# Patient Record
Sex: Female | Born: 1950 | Race: White | Hispanic: No | Marital: Married
Health system: Midwestern US, Academic
[De-identification: ages and names within clinical notes are randomized; demographics above are authoritative.]

---

## 2020-09-09 ENCOUNTER — Encounter: Admit: 2020-09-09 | Discharge: 2020-09-09 | Payer: MEDICARE

## 2020-09-09 NOTE — Telephone Encounter
09/09/20 - I will take care of this.  Records will be saved in the G-drive for 90 days or until appt is scheduled (whichever comes first)./ sjg  _________________________________________________________    Good afternoon,    Please scan to chart. No appt scheduled yet.  Lanise Mergen  MRN: [id number]    Thanks.    Franchot Mimes, Patient Service Representative [facility] Phone [phone] - Fax [phone] - [email or web address]  [address] [place], [place] [address]

## 2020-09-10 ENCOUNTER — Encounter: Admit: 2020-09-10 | Discharge: 2020-09-10 | Payer: MEDICARE

## 2020-09-10 NOTE — Telephone Encounter
-----   Message from [place] sent at 09/09/2020  3:44 PM CST -----  Regarding: Med Rec Req - New pt  Good afternoon,    Please request additional records from Aquilla Solian, APRN - Ph: [phone]; Fax: [phone]. Received Office Visit only.     Appt schedule with SBG on 12/21 at [facility].    Thanks.

## 2020-10-12 ENCOUNTER — Encounter: Admit: 2020-10-12 | Discharge: 2020-10-12 | Payer: MEDICARE

## 2020-10-13 ENCOUNTER — Encounter: Admit: 2020-10-13 | Discharge: 2020-10-13 | Payer: MEDICARE

## 2020-10-13 ENCOUNTER — Inpatient Hospital Stay: Admit: 2020-10-13 | Payer: MEDICARE

## 2020-10-13 ENCOUNTER — Inpatient Hospital Stay: Admit: 2020-10-13 | Discharge: 2020-10-13 | Payer: MEDICARE

## 2020-10-13 DIAGNOSIS — E039 Hypothyroidism, unspecified: Secondary | ICD-10-CM

## 2020-10-13 DIAGNOSIS — I5030 Unspecified diastolic (congestive) heart failure: Secondary | ICD-10-CM

## 2020-10-13 DIAGNOSIS — R000 Tachycardia, unspecified: Secondary | ICD-10-CM

## 2020-10-13 DIAGNOSIS — I10 Essential (primary) hypertension: Secondary | ICD-10-CM

## 2020-10-13 DIAGNOSIS — R0600 Dyspnea, unspecified: Secondary | ICD-10-CM

## 2020-10-13 DIAGNOSIS — R609 Edema, unspecified: Secondary | ICD-10-CM

## 2020-10-13 DIAGNOSIS — F419 Anxiety disorder, unspecified: Secondary | ICD-10-CM

## 2020-10-13 LAB — COMPREHENSIVE METABOLIC PANEL
Lab: 0.7 mg/dL — ABNORMAL HIGH (ref 0.4–1.00)
Lab: 105 MMOL/L — ABNORMAL LOW (ref 98–110)
Lab: 11 K/UL — ABNORMAL LOW (ref 3–12)
Lab: 142 MMOL/L (ref 137–147)
Lab: 15 U/L — ABNORMAL HIGH (ref 7–56)
Lab: 15 mg/dL (ref 7–25)
Lab: 23 U/L (ref 7–40)
Lab: 26 MMOL/L (ref 21–30)
Lab: 3.7 g/dL — ABNORMAL LOW (ref 3.5–5.0)
Lab: 4.3 MMOL/L (ref 3.5–5.1)
Lab: 89 mg/dL — ABNORMAL LOW (ref 70–100)
Lab: 97 U/L (ref 25–110)
Lab: >60 mL/min (ref >60)

## 2020-10-13 LAB — TROPONIN-I
Lab: 0 ng/mL (ref 0.0–0.05)
Lab: 0 ng/mL (ref 0.0–0.05)
Lab: 0 ng/mL — ABNORMAL HIGH (ref 0.0–0.05)

## 2020-10-13 LAB — CBC AND DIFF
Lab: 0 K/UL (ref 0–0.45)
Lab: 9.3 K/UL (ref 4.5–11.0)

## 2020-10-13 LAB — COVID-19 (SARS-COV-2) PCR

## 2020-10-13 LAB — MAGNESIUM: Lab: 1.8 mg/dL (ref 1.6–2.6)

## 2020-10-13 LAB — BNP (B-TYPE NATRIURETIC PEPTI): Lab: 997 pg/mL — ABNORMAL HIGH (ref 0–100)

## 2020-10-13 LAB — PROTIME INR (PT): Lab: 1.1 M/UL — ABNORMAL HIGH (ref 0.8–1.2)

## 2020-10-13 MED ORDER — SENNOSIDES-DOCUSATE SODIUM 8.6-50 MG PO TAB
1 | Freq: Two times a day (BID) | ORAL | 0 refills | Status: AC
Start: 2020-10-13 — End: ?
  Administered 2020-10-14 – 2020-10-16 (×4): 1 via ORAL

## 2020-10-13 MED ORDER — SIMETHICONE 80 MG PO CHEW
80 mg | ORAL | 0 refills | Status: AC | PRN
Start: 2020-10-13 — End: ?

## 2020-10-13 MED ORDER — BISACODYL 10 MG RE SUPP
10 mg | Freq: Every day | RECTAL | 0 refills | Status: AC | PRN
Start: 2020-10-13 — End: ?

## 2020-10-13 MED ORDER — CARVEDILOL 12.5 MG PO TAB
12.5 mg | Freq: Two times a day (BID) | ORAL | 0 refills | Status: AC
Start: 2020-10-13 — End: ?
  Administered 2020-10-14 – 2020-10-16 (×6): 12.5 mg via ORAL

## 2020-10-13 MED ORDER — LISINOPRIL 20 MG PO TAB
20 mg | Freq: Every day | ORAL | 0 refills | Status: AC
Start: 2020-10-13 — End: ?
  Administered 2020-10-14: 14:00:00 20 mg via ORAL

## 2020-10-13 MED ORDER — FUROSEMIDE 10 MG/ML IJ SOLN
40 mg | Freq: Once | INTRAVENOUS | 0 refills | Status: DC
Start: 2020-10-13 — End: 2020-10-14

## 2020-10-13 MED ORDER — ONDANSETRON 4 MG PO TBDI
4 mg | ORAL | 0 refills | Status: DC | PRN
Start: 2020-10-13 — End: 2020-10-13

## 2020-10-13 MED ORDER — MELATONIN 3 MG PO TAB
3 mg | Freq: Every evening | ORAL | 0 refills | Status: AC | PRN
Start: 2020-10-13 — End: ?

## 2020-10-13 MED ORDER — ACETAMINOPHEN 325 MG PO TAB
650 mg | ORAL | 0 refills | Status: AC | PRN
Start: 2020-10-13 — End: ?
  Administered 2020-10-14 – 2020-10-16 (×3): 650 mg via ORAL

## 2020-10-13 MED ORDER — HEPARIN, PORCINE (PF) 5,000 UNIT/0.5 ML IJ SYRG
5000 [IU] | SUBCUTANEOUS | 0 refills | Status: AC
Start: 2020-10-13 — End: ?
  Administered 2020-10-14 (×2): 5000 [IU] via SUBCUTANEOUS

## 2020-10-13 MED ORDER — ONDANSETRON HCL (PF) 4 MG/2 ML IJ SOLN
4 mg | INTRAVENOUS | 0 refills | Status: DC | PRN
Start: 2020-10-13 — End: 2020-10-13

## 2020-10-13 MED ORDER — SERTRALINE 25 MG PO TAB
25 mg | Freq: Every evening | ORAL | 0 refills | Status: DC
Start: 2020-10-13 — End: 2020-10-13

## 2020-10-13 MED ORDER — POLYETHYLENE GLYCOL 3350 17 GRAM PO PWPK
1 | Freq: Every day | ORAL | 0 refills | Status: AC | PRN
Start: 2020-10-13 — End: ?

## 2020-10-13 MED ORDER — LEVOTHYROXINE 75 MCG PO TAB
150 ug | Freq: Every day | ORAL | 0 refills | Status: AC
Start: 2020-10-13 — End: ?
  Administered 2020-10-14: 13:00:00 150 ug via ORAL

## 2020-10-13 NOTE — Progress Notes
Records Request    Khari Lett DOB: 04-18-1951    STAT- APPT TODAY    Medical records request for continuation of care:    Patient has appointment TODAY  with Dr. Arna Medici.    Please fax records to Cardiovascular [facility] [phone]    Request records:    Office Notes    EKG's           Recent Cardiac Testing    Any cardiac-related records    Recent Labs    Procedures    H&P/Discharge Summary    Operative Reports- Cardiac    Other      Thank you,      Cardiovascular Medicine  [facility]  [address]  [place], [place] [address]  Phone:  [phone]  Fax:  [phone]    STAT-APPT TODAY

## 2020-10-13 NOTE — H&P (View-Only)
History and Physical    Melinda Chen, Melinda Chen is a 69 y.o. female admitted on 10/13/2020.    Chief Complaint: dyspnea, orthopnea    Assessment/Plan     Melinda Chen, Melinda Chen is a 69 y.o. female morbidly obese w/ HTN, tachycardia, hypothyroidism, anxiety, BLE edema, presented to [facility] from cardiology clinic after she was noted to be tachycardic, hypertensive w/ dyspnea on exertion and orthopnea.       Dyspnea/orthopnea, stable  BLE edema, stable  - pt has been having dyspnea/BLE edema, wt gain since beginning of the year; had been progressively worsening. Gotten to the point where she wasn't able to walk more than 133ft prior to stopping to rest due to her dyspnea. Sx started to improve (both dyspnea, BLE edema) starting 08/2020 after pt was started on lasix 40mg  po bid. Since starting diuretic, she lost significant amount of wt as well (298lb to 229lb), which has also helped w/ her sx. Lasix dose recently decreased to lasix 40mg  po daily  - pt's dyspnea, BLE edema is actually much better today than it had been since starting her meds. She is now able to walk >1069ft w/o stopping (she is able to walk from parking lot to her clinic in the building)  - on arrival, pt was hypertensive and tachycardic, but this improved after starting BP meds (as below). Pt appears euvolemic on exam  Plan:  - continue home lasix 40mg  po dailly  - monitor on tele; keep K>4, Mg>2  - low Na diet, 2L fluid restriction; dietician consult  - HF consult  - getting TTE    HTN, uncontrolled  Tachycardia  - BP on arrival at 175/110; HR was 110 (in cardiology clinic, HR was 130s). Pt asymptomatic- no chest pain, dyspnea, n/v, lightheadedness/dizziness  - 12/21 ECG: HR 108, ST, QTc 520  Plan:  - starting coreg 12.5mg  bid and lisinopril 20mg  daily; titrate up as needed  - monitor on tele    Prolonged QTc  - hold all QTc prolonging meds (holding home zoloft for now)  - recheck ECG prior to d/c    Hypothyroidism  - checking TSH/T4  - continue home levothyroxine      DVT ppx: heparin    Fluids: n/a  Electrolytes: monitor bid  Nutrition: low Na, 2L fluid restriction  PT/OT: consulted      Dispo: admit to medicine       I spent a total of more than 70 minutes directly involved in pt care today with more than half of the time spent counseling pt face-to-face (explaining treatment options, disease processes, laboratory/imaging results, prognosis, risks and benefits of treatment options, medication side effects, importance of compliance with treatment, risk factor reduction, follow up with primary care physician), and on the unit/floor coordinating care.        Staff name:  Marcine Matar, MD Date:  10/13/2020   Med-private Team Sw[employer] 4  Team Pager [phone]    HPI     Pt presented to cardiology clinic today for routine appointment to establish care. Pt had been having trouble w/ dyspnea and wt gain throughout this whole year. She was evaluated by her PCP in 08/2020, at which time she was noted to have fluid in her lungs on CXR. She was started on lasix 40mg  po bid. At that time, she was also dxed w/ hypothyroidism; she was started on levothyroxine. After starting diuretic, her dyspnea and orthopnea improved significantly. Swelling in her legs also improved significantly. She had dramatic wt loss after  starting diuretic as well (11/15 wt 298lb. Down to 229lb on 12/21).   When seen by her PCP in 08/2020, she was also noted to have elevated BP. She wasn't started on any new meds. Was instructed to check her BP at home. BP had been ranging from 150s/90s at home; however, she suspects this is not accurate.   In cardiology clinic today, pt was noted to be severely hypertensive (170s/120s); she was also tachycardic (HR up to 130s). Despite her abnml v/s, pt reports that she has been doing well. She denies having any chest pain, dyspnea, palpitations, lightheadedness/dizziness. No n/v; admits to having abd distension and constipation, but no hematochezia/melena. No problems w/ UOP.       Past Medical History:  Medical History:   Diagnosis Date   ? Anxiety 10/13/2020   ? Class 3 severe obesity with body mass index (BMI) of 45.0 to 49.9 in adult [facility]) 10/13/2020   ? Edema 10/13/2020   ? Hypothyroid 10/13/2020   ? Tachycardia 10/13/2020       No past surgical history on file.    Medications:  No current facility-administered medications on file prior to encounter.     Current Outpatient Medications on File Prior to Encounter   Medication Sig Dispense Refill   ? furosemide (LASIX) 40 mg tablet Take 40 mg by mouth daily.     ? levothyroxine (SYNTHROID) 150 mcg tablet Take 150 mcg by mouth daily.     ? sertraline (ZOLOFT) 25 mg tablet Take 25 mg by mouth at bedtime daily. Take 1/2 tablet at bedtime         Allergies:  No Known Allergies      Family History:  Family History   Problem Relation Age of Onset   ? None Reported Mother    ? None Reported Father        Social History:  Social History     Socioeconomic History   ? Marital status: Married     Spouse name: Not on file   ? Number of children: Not on file   ? Years of education: Not on file   ? Highest education level: Not on file   Occupational History   ? Not on file   Tobacco Use   ? Smoking status: Former Smoker     Types: Cigarettes   ? Smokeless tobacco: Never Used   Substance and Sexual Activity   ? Alcohol use: Never   ? Drug use: Never   ? Sexual activity: Not on file   Other Topics Concern   ? Not on file   Social History Narrative   ? Not on file         Review of Systems:  Review of Systems   Constitutional: Negative for chills, diaphoresis, fever, malaise/fatigue and weight loss.   HENT: Negative.    Eyes: Negative.    Respiratory: Negative for cough, hemoptysis, sputum production and shortness of breath.    Cardiovascular: Positive for orthopnea and leg swelling. Negative for chest pain and palpitations.   Gastrointestinal: Positive for constipation. Negative for abdominal pain, blood in stool, diarrhea, heartburn, melena, nausea and vomiting.   Musculoskeletal: Negative.    Skin: Negative.    Neurological: Negative for dizziness, tremors, seizures, loss of consciousness, weakness and headaches.   Endo/Heme/Allergies: Negative.    Psychiatric/Behavioral: Negative.        Physical Exam:  Vital signs:  Vitals:    10/13/20 1546   BP: (!) 175/107  BP Source: Arm, Right Upper   Pulse: 110   Temp: 36.6 ?C (97.9 ?F)   SpO2: 97%   Weight: 104.1 kg (229 lb 9.6 oz)   Height: 167.6 cm (66)       No intake or output data in the 24 hours ending 10/13/20 1644    Physical exam:  General: NAD  Neck: no JVD  Cardiovascular: tachycardia, regular rhythm, no murmurs.   Pulmonary/Chest: Effort normal and breath sounds normal. No respiratory distress. No wheezes or crackles  Abdominal: Soft, nondistended, nontender, and no mass. Bowel sounds are normal.  Ext: +BLE edema. Nml distal pulses  Neurological: A&Ox4, no focal findings  Skin: Warm and dry. No rash noted. No erythema. No pallor.  Psychiatric: Normal mood and affect. Behavior is normal. Judgement and thought content normal.     Labs and imaging pending

## 2020-10-14 ENCOUNTER — Encounter: Admit: 2020-10-14 | Discharge: 2020-10-14 | Payer: MEDICARE

## 2020-10-14 ENCOUNTER — Inpatient Hospital Stay: Admit: 2020-10-14 | Discharge: 2020-10-14 | Payer: MEDICARE

## 2020-10-14 DIAGNOSIS — R000 Tachycardia, unspecified: Secondary | ICD-10-CM

## 2020-10-14 DIAGNOSIS — E039 Hypothyroidism, unspecified: Secondary | ICD-10-CM

## 2020-10-14 DIAGNOSIS — R609 Edema, unspecified: Secondary | ICD-10-CM

## 2020-10-14 DIAGNOSIS — F419 Anxiety disorder, unspecified: Secondary | ICD-10-CM

## 2020-10-14 MED ORDER — PERFLUTREN LIPID MICROSPHERES 1.1 MG/ML IV SUSP
1-20 mL | Freq: Once | INTRAVENOUS | 0 refills | Status: CP | PRN
Start: 2020-10-14 — End: ?
  Administered 2020-10-14: 15:00:00 1 mL via INTRAVENOUS

## 2020-10-14 MED ADMIN — SPIRONOLACTONE 25 MG PO TAB [7437]: 25 mg | ORAL | @ 18:00:00 | NDC 63739054410

## 2020-10-14 MED ADMIN — MAGNESIUM SULFATE IN D5W 1 GRAM/100 ML IV PGBK [166578]: 1 g | INTRAVENOUS | @ 21:00:00 | Stop: 2020-10-14 | NDC 00338170940

## 2020-10-14 MED ADMIN — FUROSEMIDE 10 MG/ML IJ SOLN [3291]: 40 mg | INTRAVENOUS | @ 18:00:00 | Stop: 2020-10-14 | NDC 36000028325

## 2020-10-14 MED ADMIN — MAGNESIUM OXIDE 400 MG (241.3 MG MAGNESIUM) PO TAB [10491]: 400 mg | ORAL | @ 06:00:00 | Stop: 2020-10-14 | NDC 10006070028

## 2020-10-14 MED ADMIN — MAGNESIUM SULFATE IN D5W 1 GRAM/100 ML IV PGBK [166578]: 1 g | INTRAVENOUS | @ 20:00:00 | Stop: 2020-10-14 | NDC 00338170940

## 2020-10-15 ENCOUNTER — Encounter: Admit: 2020-10-15 | Discharge: 2020-10-15 | Payer: MEDICARE

## 2020-10-15 ENCOUNTER — Inpatient Hospital Stay: Admit: 2020-10-15 | Discharge: 2020-10-15 | Payer: MEDICARE

## 2020-10-15 MED ADMIN — LORAZEPAM 2 MG/ML IJ SYRG [86485]: 1 mg | INTRAVENOUS | @ 05:00:00 | Stop: 2020-10-15 | NDC 00409198503

## 2020-10-15 MED ADMIN — ASPIRIN 81 MG PO CHEW [680]: 324 mg | ORAL | @ 12:00:00 | Stop: 2020-10-15 | NDC 66553000201

## 2020-10-15 MED ADMIN — LEVOTHYROXINE 125 MCG PO TAB [4424]: 125 ug | ORAL | @ 12:00:00 | NDC 51079044301

## 2020-10-15 MED ADMIN — THIAMINE MONONITRATE (VIT B1) 100 MG PO TAB [303868]: 100 mg | ORAL | @ 14:00:00 | Stop: 2020-10-20 | NDC 50268085111

## 2020-10-15 MED ADMIN — GADOBENATE DIMEGLUMINE 529 MG/ML (0.1MMOL/0.2ML) IV SOLN [135881]: 40 mL | INTRAVENOUS | @ 06:00:00 | Stop: 2020-10-15 | NDC 00270516415

## 2020-10-15 MED ADMIN — ENOXAPARIN 40 MG/0.4 ML SC SYRG [85052]: 40 mg | SUBCUTANEOUS | @ 02:00:00 | NDC 00781324602

## 2020-10-15 MED ADMIN — LOSARTAN 25 MG PO TAB [80886]: 25 mg | ORAL | @ 14:00:00 | NDC 00904704761

## 2020-10-16 MED ADMIN — THIAMINE MONONITRATE (VIT B1) 100 MG PO TAB [303868]: 100 mg | ORAL | @ 14:00:00 | Stop: 2020-10-17 | NDC 50268085111

## 2020-10-16 MED ADMIN — SPIRONOLACTONE 25 MG PO TAB [7437]: 25 mg | ORAL | @ 14:00:00 | Stop: 2020-10-17 | NDC 63739054410

## 2020-10-16 MED ADMIN — LOSARTAN 25 MG PO TAB [80886]: 25 mg | ORAL | @ 14:00:00 | Stop: 2020-10-17 | NDC 00904704761

## 2020-10-16 MED ADMIN — ERGOCALCIFEROL (VITAMIN D2) 1,250 MCG (50,000 UNIT) PO CAP [81876]: 50000 [IU] | ORAL | @ 14:00:00 | Stop: 2020-10-17 | NDC 60687050011

## 2020-10-16 MED ADMIN — FUROSEMIDE 10 MG/ML IJ SOLN [3291]: 40 mg | INTRAVENOUS | @ 14:00:00 | Stop: 2020-10-16 | NDC 36000028325

## 2020-10-16 MED ADMIN — LEVOTHYROXINE 125 MCG PO TAB [4424]: 125 ug | ORAL | @ 12:00:00 | Stop: 2020-10-17 | NDC 51079044301

## 2020-10-16 MED ADMIN — FLU VACC QS2021-22(65YR UP)-PF 240 MCG/0.7 ML IM SYRG [457008]: 0.7 mL | INTRAMUSCULAR | @ 22:00:00 | Stop: 2020-10-16 | NDC 49281012188

## 2020-10-19 ENCOUNTER — Encounter: Admit: 2020-10-19 | Discharge: 2020-10-19 | Payer: MEDICARE

## 2020-10-19 NOTE — Telephone Encounter
Patient Discharge Date from hospital: 10/16/20  Date Call Attempted: 10/19/20  Number of Attempts: 1  Date Call Completed: 10/19/20     Next Appointment    Next follow-up telehealth appointment on 10/21/20 with Satira Anis, APRN.    Transportation    NA     Home Health    No    Medications    Pt states she has all her medications and has no questions at this time.    Diet    200 mg cholesterol, 2 G Na, 2 L fluid restriction    Scale/Weight    Yes, first morning weight today was 221.4.    Signs and Symptoms    Pt reports the following symptoms:     No BLE edema or abdominal bloating. No SOA at rest or with exertion. No PND.     Pt verbalized understanding of signs and symptoms of HF and when to contact a provider or seek immediate assistance at the ER.    Intervention(s)    Pt educated on the importance of weighing daily first thing in the morning after voiding using the same scale in the same location and write results down in note pad or log. Notify us for weight gains of 3 lbs in one day or 5 lbs in one week. Notify us for increased SOA.  Notify us for swelling or increased swelling in BLE or abdominal fullness/bloating. Call 911 for sudden, severe chest/pain pressure/SOA develops. Be sure to make your follow up appointment and bring your weight logs, B/P logs, and medication list with you to your appointment. Call us at [phone] if you have any questions.       Plan of Care    Continued education needed for heart failure symptom management and when to contact our office.

## 2020-10-20 ENCOUNTER — Encounter: Admit: 2020-10-20 | Discharge: 2020-10-20 | Payer: MEDICARE

## 2020-10-20 ENCOUNTER — Ambulatory Visit: Admit: 2020-10-20 | Discharge: 2020-10-20 | Payer: MEDICARE

## 2020-10-20 DIAGNOSIS — Z9189 Other specified personal risk factors, not elsewhere classified: Secondary | ICD-10-CM

## 2020-10-21 ENCOUNTER — Encounter: Admit: 2020-10-21 | Discharge: 2020-10-21 | Payer: MEDICARE

## 2020-10-21 ENCOUNTER — Ambulatory Visit: Admit: 2020-10-21 | Discharge: 2020-10-21 | Payer: MEDICARE

## 2020-10-21 DIAGNOSIS — E039 Hypothyroidism, unspecified: Secondary | ICD-10-CM

## 2020-10-21 DIAGNOSIS — I5022 Chronic systolic (congestive) heart failure: Secondary | ICD-10-CM

## 2020-10-21 DIAGNOSIS — I10 Essential (primary) hypertension: Secondary | ICD-10-CM

## 2020-10-21 DIAGNOSIS — R000 Tachycardia, unspecified: Secondary | ICD-10-CM

## 2020-10-21 DIAGNOSIS — R609 Edema, unspecified: Secondary | ICD-10-CM

## 2020-10-21 DIAGNOSIS — F419 Anxiety disorder, unspecified: Secondary | ICD-10-CM

## 2020-10-21 NOTE — Progress Notes
Date of Service: 10/21/2020    Obtained patient's verbal consent to treat them and their agreement to [facility] financial policy and NPP via this telehealth visit during the [facility].    This video home visit was conducted via communication between the patient and physician/providerdistance. We have found that certain health care needs can be provided without an in-person visit.. This service lets Melinda Chen provide the care patients' need.  If a prescription is necessary we can send it directly to their pharmacy.  If testing is necessary this can be arranged.    Melinda Chen is a 69 y.o. female.     HPI      Melinda Chen was seen today in heart failure clinic for posthospitalization follow-up visit.  Patient was recently seen by Dr. Arna Medici on 10/13/20 to establish care with cardiology and reported having symptoms of dyspnea with exertion, orthopnea, tachycardia and edema for the past year. She reportedly went to urgent care 09/07/20 and was found to have hypothyroidism (thyroid levels were undetectable) and hypervolemia.  Chest xray showed pulmonary vascular congestion, BNP 1420. She was started on lasix 40 mg bid and ~2 weeks later when she returned to the primary care office her Lasix was decreased to 40 mg daily and she was instructed to establish care with cardiology. When she was seen by Dr Arna Medici; she stated that she lost 72 lbs since starting lasix and her dyspnea improved. Dr. Arna Medici recommended hospitalization for decompensated CHF. She was admitted on 10/13/20 to 10/16/20 for acute on chronic systolic heart failure.  Her echo during admission showed EF 22%, mod/sev MR, RV dilation and elevated CVP. LHC showed no obstructive disease. 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 She was started on GDMT with coreg 12.5 mg bid, losartan 25 mg daily, aldactone 25 mg daily, lasix 40 mg daily. She was also given a Technical sales engineer at discharge. Her TSH was found to be low with high Free T4, her levothyroxine dose was 125 mcg. She needs to repeat TFTs in 4-6 weeks. Her admit weight 230 lbs; discharge weight was 223 lbs.     Today she reports that since hospitalizations she has been feeling well. Her daily weights have gone from 223 lbs at discharge to 219 lbs on 10/21/20. Her home BP is averaging 120/80, HR 80, 02 sats 92-96. Her my fitness pal app shows that she is consuming between 1479-2172 mg Na. She is consuming 64 fluid ounces of liquid daily. She denies orthopnea, paroxysmal nocturnal dyspnea, early satiety, abdominal distention, lower extremity edema, chest pain, irregular heartbeat/palpitations, dizziness, and/or syncope/near syncope.      Past medical history includes HFpEF, HTN, hypothyroidism, morbid obesity.        Telehealth Patient Reported Vitals     Row Name 10/21/20 1128 10/21/20 1118             BP: 127/84 ?       BP Source: Arm, Left Upper ?       BP Position: Sitting ?       BP Method: Automatic/Digital Reading ?       Pulse: 85 ?       Weight: 99.7 kg (219 lb 12.8 oz) ?       Height: 1.676 m (5' 6) ?       SpO2: 94 % ?       Pain Score: Zero ?       Pain Score ? Zero  Patient Position ? Sitting       BP Source ? Arm, Right Upper                 Reports having a blood pressure cuff and scale.     Body mass index is 35.48 kg/m?Marland Kitchen     Review of Systems   Constitutional: Positive for weight loss.   Cardiovascular: Negative for chest pain, dyspnea on exertion, leg swelling, orthopnea, palpitations and paroxysmal nocturnal dyspnea.   Respiratory: Negative for shortness of breath.    Gastrointestinal: Negative for bloating and abdominal pain.   Neurological: Negative for weakness.       Physical exam limited d/t video tele health visit:  General Appearance: patient in no apparent distress.  Skin: pink, no jaundice, exposed skin is intact  Eyes: conjunctivae and lids normal, pupils are equal and round   Lips: no pallor or cyanosis   Neck Veins: VESSELS:JVP difficult to appreciate Respiratory Effort: breathing comfortably, no respiratory distress   Lower extremities: unable to see on telehealth  Orientation: oriented to time, place and person   PSYCH: Appropriate   Language and Memory: patient responsive and seems to comprehend information   NEURO: Alert and conversant    Cardiovascular Studies  Echo 10/14/20  ? The left ventricular systolic function is severely reduced. The ejection fraction by Simpson's biplane method is 22%.  ? The right ventricle is moderately dilated with moderately reduced function.  ? Moderate-severe posteriorly directed likely functional mitral valve regurgitation present.     Tachycardia throughout the study.   There are no prior studies available for comparison- patient has already been admitted to the hospital for futher management.   ?  Right and left Heart Cath 10/15/20  CONCLUSIONS:    1. Elevated filling pressures, most notably on the left side with elevated pulmonary artery pressures.  2. Preserved cardiac output and cardiac index.  3. No significant coronary artery disease.      Problems Addressed Today  Encounter Diagnoses   Name Primary?   ? Primary hypertension Yes   ? Chronic systolic heart failure (HCC)        Assessment and Plan     Chronic Heart Failure with Reduced Ejection Fraction  -Echo on 10/14/20 showed an EF of 22%.  -Today, she describes NYHA Functional Class II symptoms however due to nature of tele health visit I am unable to physical examine her. Will not adjust her medications doses; encouraged her to continue adhering to low sodium diet and fluid restrictions.   GDMT Current Doses Changes   BB Coreg 12.5 mg bid No changes    ACEI/ARB/ARNI Losartan 25 mg daily No Changes    SGLT-2 Inhibitor Not receiving therapeutic doses of ACE-i/ARB and BB   *will consider after routine labs are checked at her next f/u visit   Aldosterone Antagonist Spironolactone 25 mg daily No changes   Diuretic Lasix 40 mg daily No changes   Hydralazine/ Nitrate N/A Ivabradine NA    HRMT *Pt discharged wearing Life Vest      Anticoagulation for Afib/flutter  N/A      ?Cardiac Rehab Evaluation? for LVEF <40%  NA    7-day post hospital follow up scheduled within 48hrs of discharge Yes      Non-Ischemic Cardiomyopathy:    -GDMT as described above. She is currently wearing Life Vest. Will need to repeat echocardiogram in the next 2-3 months to evaluate if her EF has improved or alternatively if she will  require device therapy.     Hypertension:  Controlled on current regimen, encouraged her to continue checking her v/s at home.     Follow Up Visit: Will have her scheduled back with Dr Arna Medici when he returns to [place] clinic next time.          Education/care coordination and counseling time spent: 35 minutes of 40 minute visit.  Topics included HF disease process, medication instructions, daily weight monitoring, sodium and fluid restriction, understanding of HF symptoms, awareness of when and whom call, and when to schedule next office visit.     Thank you for the opportunity to participate in this pleasant patient's care. Please do not hesitate to contact me with any questions/concerns.    Satira Anis APRN   Pager [phone]    Collaborating MD:[facility]    Current Medications (including today's revisions)  ? carvediloL (COREG) 12.5 mg tablet Take one tablet by mouth twice daily. Take with food.   ? [START ON 10/23/2020] ergocalciferol (vitamin D2) (DRISDOL) 1,250 mcg (50,000 unit) capsule Take one capsule by mouth every 7 days.   ? furosemide (LASIX) 40 mg tablet Take 40 mg by mouth daily.   ? levothyroxine (SYNTHROID) 125 mcg tablet Take one tablet by mouth daily.   ? losartan (COZAAR) 25 mg tablet Take one tablet by mouth daily.   ? sertraline (ZOLOFT) 25 mg tablet Take 25 mg by mouth at bedtime daily. Take 1/2 tablet at bedtime   ? spironolactone (ALDACTONE) 25 mg tablet Take one tablet by mouth daily. Take with food.

## 2020-10-22 ENCOUNTER — Encounter: Admit: 2020-10-22 | Discharge: 2020-10-22 | Payer: MEDICARE

## 2020-10-27 ENCOUNTER — Encounter: Admit: 2020-10-27 | Discharge: 2020-10-27 | Payer: MEDICARE

## 2020-10-27 NOTE — Telephone Encounter
Patient is scheduled with Dr. Geronimo Boot 11/19/20 and with Dr. Arna Medici 12/11/19. Patient is aware of date time and location.     Lab order faxed to [facility]. [phone]

## 2020-10-27 NOTE — Telephone Encounter
-----   Message from Valora Piccolo, RN sent at 10/27/2020  1:40 PM CST -----  SBG pt.   ----- Message -----  From: Ardelle Lesches  Sent: 10/27/2020   1:38 PM CST  To: Cvm Nurse Gen Card Team [facility]    Per  jan  caldwell this pt needs to be seen  ,  hopefully  next month in the  [place]  office  can  you assit ?  Thanks  a lot

## 2020-11-11 ENCOUNTER — Encounter: Admit: 2020-11-11 | Discharge: 2020-11-11 | Payer: MEDICARE

## 2020-11-11 NOTE — Telephone Encounter
Attempted to contact pt about getting set up for cardiac rehab. Number out of service, therefore unable to leave a message. Cardiac rehab staff will attempt another call.

## 2020-11-12 ENCOUNTER — Encounter: Admit: 2020-11-12 | Discharge: 2020-11-12 | Payer: MEDICARE

## 2020-11-12 DIAGNOSIS — I10 Essential (primary) hypertension: Secondary | ICD-10-CM

## 2020-11-12 DIAGNOSIS — I5022 Chronic systolic (congestive) heart failure: Secondary | ICD-10-CM

## 2020-11-12 LAB — BASIC METABOLIC PANEL
Lab: 1.1
Lab: 101
Lab: 137
Lab: 14
Lab: 27
Lab: 27
Lab: 4.5
Lab: 49
Lab: 9.5
Lab: 94

## 2020-11-13 ENCOUNTER — Encounter: Admit: 2020-11-13 | Discharge: 2020-11-13 | Payer: MEDICARE

## 2020-11-13 NOTE — Telephone Encounter
-----   Message from [name], APRN-NP sent at 11/13/2020 10:06 AM CST -----  Stable labs; I think I sent a message to get an update on her weights and symptoms please. ThanksJC

## 2020-11-13 NOTE — Telephone Encounter
Called pt for update. She states she feels great no symptoms to report.    She did inquire about a refill for her Levothyroxine and we discussed she will call her PCP since she originally filled the prescription.

## 2020-11-16 ENCOUNTER — Encounter: Admit: 2020-11-16 | Discharge: 2020-11-16 | Payer: MEDICARE

## 2020-11-16 NOTE — Telephone Encounter
Today I contacted the patient with regards to scheduling an initial orientation appointment for Cardiopulmonary Rehabilitation. After stating the purpose of the phone call and providing a brief overview of the program, the pt is no longer interested in participating and elected to decline our service. The pt stated she has an exercise routine at home that she feels comfortable continuing. Before ending the phone call with the patient I shared with them the department's phone number [phone]) and informed them, should they change their mind and would like to participate in the future, they can contact us. In addition to this encounter, a note was made on our department's paper tracking. Pt will be removed from our call/waitlist.

## 2020-11-19 ENCOUNTER — Encounter: Admit: 2020-11-19 | Discharge: 2020-11-19 | Payer: MEDICARE

## 2020-11-19 DIAGNOSIS — R0600 Dyspnea, unspecified: Secondary | ICD-10-CM

## 2020-11-19 DIAGNOSIS — R609 Edema, unspecified: Secondary | ICD-10-CM

## 2020-11-19 DIAGNOSIS — I10 Essential (primary) hypertension: Secondary | ICD-10-CM

## 2020-11-19 DIAGNOSIS — I5022 Chronic systolic (congestive) heart failure: Secondary | ICD-10-CM

## 2020-11-19 DIAGNOSIS — I5023 Acute on chronic systolic (congestive) heart failure: Secondary | ICD-10-CM

## 2020-11-19 DIAGNOSIS — E039 Hypothyroidism, unspecified: Secondary | ICD-10-CM

## 2020-11-19 DIAGNOSIS — R000 Tachycardia, unspecified: Secondary | ICD-10-CM

## 2020-11-19 DIAGNOSIS — F419 Anxiety disorder, unspecified: Secondary | ICD-10-CM

## 2020-11-19 MED ORDER — LOSARTAN 50 MG PO TAB
50 mg | ORAL_TABLET | Freq: Every day | ORAL | 3 refills | 30.00000 days | Status: AC
Start: 2020-11-19 — End: ?

## 2020-11-19 NOTE — Patient Instructions
Echocardiogram    Take 50mg  Cozaar daily    Follow up with Dr. in February

## 2020-11-20 ENCOUNTER — Encounter: Admit: 2020-11-20 | Discharge: 2020-11-20 | Payer: MEDICARE

## 2020-11-20 ENCOUNTER — Ambulatory Visit: Admit: 2020-11-20 | Discharge: 2020-11-20 | Payer: MEDICARE

## 2020-11-20 DIAGNOSIS — Z9189 Other specified personal risk factors, not elsewhere classified: Secondary | ICD-10-CM

## 2020-11-25 ENCOUNTER — Encounter: Admit: 2020-11-25 | Discharge: 2020-11-25 | Payer: MEDICARE

## 2020-11-25 ENCOUNTER — Ambulatory Visit: Admit: 2020-11-25 | Discharge: 2020-11-25 | Payer: MEDICARE

## 2020-11-25 DIAGNOSIS — I10 Essential (primary) hypertension: Secondary | ICD-10-CM

## 2020-11-25 DIAGNOSIS — I5022 Chronic systolic (congestive) heart failure: Secondary | ICD-10-CM

## 2020-11-25 DIAGNOSIS — R0600 Dyspnea, unspecified: Secondary | ICD-10-CM

## 2020-11-28 ENCOUNTER — Encounter: Admit: 2020-11-28 | Discharge: 2020-11-28 | Payer: MEDICARE

## 2020-11-30 ENCOUNTER — Encounter: Admit: 2020-11-30 | Discharge: 2020-11-30 | Payer: MEDICARE

## 2020-11-30 NOTE — Telephone Encounter
-----   Message from Rogelia Boga, RN sent at 11/30/2020  9:49 AM CST -----  Regarding: Ask TLR to review results  Received the following MyChart message.  Pt has f/u next week with Dr. Arna Medici but originally saw TLR.  Please discuss with him and follow up with the patient.    Was wondering if the results showed improvement enough to be able to safely be without the Life Vest

## 2020-11-30 NOTE — Telephone Encounter
Discussed situation with Dr. Geronimo Boot regarding echo results. He recommends that patient be seen by Dr. Arna Medici prior to removal of the life vest.     Spoke with patient regarding her questions about her life vest. Patient verbalizes understanding and has no further questions.

## 2020-12-07 NOTE — Telephone Encounter
Spoke with patient in regards to her life vest not connecting to her hot spot. Instructed patient to reset her hot spot so it will try to connect again. I told the patient that if it did not connect today we will check on it tomorrow and will call her back if needed.

## 2020-12-10 MED ORDER — CARVEDILOL 12.5 MG PO TAB
18.75 mg | ORAL_TABLET | Freq: Two times a day (BID) | ORAL | 0 refills | 90.00000 days | Status: AC
Start: 2020-12-10 — End: ?

## 2020-12-10 NOTE — Telephone Encounter
Received the following MyChart message from patient:  Melinda, Chen Cvm Nurse Triage [place]  Was just wondering at what point I might be able to do without the life vest? Is that decision based on my echo results. Due to the weather and road conditions my appointment today has been pushed out a little over a month to the 24th of March. Was going to ask that question today so I am addressing it here. Thank you.     Replied to patient asking about symptoms and recent vital signs.      Will route to Dr. Arna Medici for recommendations.

## 2020-12-10 NOTE — Telephone Encounter
To all: It appears that Mr. Gopal had a follow-up echocardiogram on 11/25/2020 which showed an improved ejection fraction of 40 to 45%. This is encouraging. Please have her continue to wear her LifeVest and try to schedule her to see me in [place] the next time I am there. We can then discuss discontinuation of the LifeVest. In the interim, please verify her blood pressure and pulse and increase her carvedilol to 25 mg twice daily if her blood pressure and pulse are satisfactory. Thanks. SBG     Hester Mates, MD  Rogelia Boga, RN; Florene Route, RN; Weston Brass  Caller: Unspecified (Today, 8:20 AM)  Please have her add an additional carvedilol 6.25 mg twice daily for a total of 18.75 mg twice daily. Please ask her to report back in approximately 1 week to see if she tolerates this dose without orthostasis or other adverse effects. Thanks. SBG     Sent patient MyChart message with Dr. Wesley Blas recommendations.  Medication list updated.  Left callback number for any questions or concerns.

## 2020-12-10 NOTE — Telephone Encounter
I am feeling really good. Ever since I was released from [place] I have done a regiment of a 30 minute walk each morning and 10,000 steps a day. Ree Kida and I purchased the home BP monitor that Dr. Arna Medici suggested. My BP and heart rate have both been good.   BP readings for the last week  122/67  122/65  123/67  121/68  125/71  126/70  Resting heart rate  63  61  59  62  64  64  65  No shortness of breath and no swelling/fluid retention

## 2020-12-15 ENCOUNTER — Encounter: Admit: 2020-12-15 | Discharge: 2020-12-15 | Payer: MEDICARE

## 2020-12-15 MED ORDER — CARVEDILOL 12.5 MG PO TAB
18.75 mg | ORAL_TABLET | Freq: Two times a day (BID) | ORAL | 0 refills | 90.00000 days | Status: AC
Start: 2020-12-15 — End: ?

## 2020-12-17 ENCOUNTER — Encounter: Admit: 2020-12-17 | Discharge: 2020-12-17 | Payer: MEDICARE

## 2020-12-21 ENCOUNTER — Encounter: Admit: 2020-12-21 | Discharge: 2020-12-21 | Payer: MEDICARE

## 2020-12-21 ENCOUNTER — Ambulatory Visit: Admit: 2020-12-21 | Discharge: 2020-12-21 | Payer: MEDICARE

## 2020-12-21 DIAGNOSIS — Z9189 Other specified personal risk factors, not elsewhere classified: Secondary | ICD-10-CM

## 2021-01-09 ENCOUNTER — Encounter: Admit: 2021-01-09 | Discharge: 2021-01-09 | Payer: MEDICARE

## 2021-01-09 MED ORDER — CARVEDILOL 12.5 MG PO TAB
ORAL_TABLET | Freq: Two times a day (BID) | 1 refills
Start: 2021-01-09 — End: ?

## 2021-01-12 ENCOUNTER — Encounter: Admit: 2021-01-12 | Discharge: 2021-01-12 | Payer: MEDICARE

## 2021-01-12 NOTE — Telephone Encounter
Call to pt;  Spoke to pt directly. Discussed no data since 01/03/21.  Pt stated she will reboot home transmitter when she gets home.  Told pt this office will wait for a report to come through today but nothing arrives, will call back tomorrow.  Pt is agreeable.    KDriver

## 2021-01-14 ENCOUNTER — Encounter: Admit: 2021-01-14 | Discharge: 2021-01-14 | Payer: MEDICARE

## 2021-01-14 DIAGNOSIS — R000 Tachycardia, unspecified: Secondary | ICD-10-CM

## 2021-01-14 DIAGNOSIS — F419 Anxiety disorder, unspecified: Secondary | ICD-10-CM

## 2021-01-14 DIAGNOSIS — E039 Hypothyroidism, unspecified: Secondary | ICD-10-CM

## 2021-01-14 DIAGNOSIS — R609 Edema, unspecified: Secondary | ICD-10-CM

## 2021-01-14 MED ORDER — SPIRONOLACTONE 25 MG PO TAB
25 mg | ORAL_TABLET | Freq: Every day | ORAL | 3 refills | 46.00000 days | Status: AC
Start: 2021-01-14 — End: ?

## 2021-01-14 MED ORDER — CARVEDILOL 25 MG PO TAB
25 mg | ORAL_TABLET | Freq: Two times a day (BID) | ORAL | 3 refills | 90.00000 days | Status: AC
Start: 2021-01-14 — End: ?

## 2021-01-14 NOTE — Patient Instructions
Increase Coreg to 25mg  daily    Discontinue life vest    Discontinue Aspirin    Follow up in 3 months and 6 months

## 2021-01-14 NOTE — Progress Notes
Date of Service: 01/14/2021    Melinda Chen is a 70 y.o. female.       HPI     I saw Melinda Chen on October 13, 2020.  At that time she had decompensated congestive heart failure and was hospitalized the same day.  She underwent right and left heart catheterization and was found to have no significant coronary disease.  She was discharged on guideline directed medical therapy and LifeVest.  Over the past 3 months she has done extremely well.  Her congestive symptoms have been very well controlled and she can now walk for an hour at a time without difficulty.  She tries to walk for at least 30 minutes every day for exercise. The patient has been doing well and reports no angina, congestive symptoms, palpitations, sensation of sustained forceful heart pounding, lightheadedness or syncope.  Her exercise tolerance has improved significantly over the past 3 months.  The patient reports no myalgias, bleeding abnormalities, or strokelike symptoms.  Melinda Chen reports no activity whatsoever from her wearable electrical defibrillator.       Vitals:    01/14/21 1032 01/14/21 1043   BP: (!) 148/84 (!) 144/84   BP Source: Arm, Left Upper Arm, Right Upper   Patient Position: Sitting Sitting   Pulse: 84    SpO2: 96%    Weight: 101.1 kg (222 lb 12.8 oz)    Height: 167.6 cm (5' 6)    PainSc: Zero      Body mass index is 35.96 kg/m?Marland Kitchen     Past Medical History  Patient Active Problem List    Diagnosis Date Noted   ? Morbid (severe) obesity due to excess calories (HCC) 11/19/2020   ? Chronic systolic heart failure (HCC) 10/21/2020   ? Acute on chronic systolic (congestive) heart failure (HCC) 10/14/2020     10/14/20 EF 20%, mod dilated RV with mod reduced function, LVIDD 6.80 cm, RV basal diameter 5.10 cm      ? Primary hypertension 10/14/2020   ? Anxiety 10/13/2020   ? Edema 10/13/2020   ? Hypothyroid 10/13/2020   ? Class 2 obesity with body mass index (BMI) of 37.0 to 37.9 in adult 10/13/2020   ? Tachycardia 10/13/2020   ? Dyspnea 10/13/2020         Review of Systems   Constitutional: Negative.   HENT: Negative.    Eyes: Negative.    Cardiovascular: Negative.    Respiratory: Negative.    Endocrine: Negative.    Hematologic/Lymphatic: Negative.    Skin: Negative.    Musculoskeletal: Negative.    Gastrointestinal: Negative.    Genitourinary: Negative.    Neurological: Negative.    Psychiatric/Behavioral: Negative.    Allergic/Immunologic: Negative.        Physical Exam  GENERAL: The patient is well developed, well nourished, resting comfortably and in no distress.   HEENT: No abnormalities of the visible oro-nasopharynx, conjunctiva or sclera are noted.  NECK: There is no jugular venous distension. Carotids are palpable and without bruits. There is no thyroid enlargement.  Chest: Lung fields are clear to auscultation. There are no wheezes or crackles.  CV: There is a regular rhythm. The first and second heart sounds are normal. There are no murmurs, gallops or rubs.  ABD: The abdomen is soft and supple with normal bowel sounds. There is no hepatosplenomegaly, ascites, tenderness, masses or bruits.  Neuro: There are no focal motor defects. Ambulation is normal. Cognitive function appears normal.  Ext: There is no  edema or evidence of deep vein thrombosis. Peripheral pulses are satisfactory.    SKIN: There are no rashes and no cellulitis  PSYCH: The patient is calm, rationale and oriented.    Cardiovascular Studies  A twelve-lead ECG obtained on October 16, 2020 shows normal sinus rhythm with a heart rate of 89 bpm.  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  There is evidence for left ventricular hypertrophy and left atrial enlargement.    Labs from November 22, 2020 revealed serum potassium 4.5 mmol/L and serum creatinine 1.17 mg/dL.    Echo Doppler study 11/25/2020:  Interpretation Summary    1. Moderate to severely dilated left ventricle with moderately reduced systolic function with a visually estimated ejection fraction of ~40-45%.  2. Diffuse hypokinesis, more profound in the inferior and inferolateral myocardial segments.  3. Mildly dilated right ventricular cavity with borderline RV systolic function  4. Indeterminate diastolic function  5. Mild left atrial enlargement  6. No Doppler evidence of significant valvular stenosis  7. Moderate mitral annular calcification without significant stenosis (mean gradient 3 mmHg at a heart rate of 67 bpm)  8. Moderate, eccentric functional mitral insufficiency  9. Mild to moderate tricuspid insufficiency  10. No pericardial effusion.    Initial echo Doppler study 10/14/2020:  Interpretation Summary    ?  ? The left ventricular systolic function is severely reduced. The ejection fraction by Simpson's biplane method is 22%.  ? The right ventricle is moderately dilated with moderately reduced function.  ? Moderate-severe posteriorly directed likely functional mitral valve regurgitation present.     Tachycardia throughout the study.   There are no prior studies available for comparison- patient has already been admitted to the hospital for futher management.   ?    Echocardiographic Findings    Left Ventricle The left ventricle is moderately dilated. Mild eccentric hypertrophy. The left ventricular systolic function is severely reduced. The ejection fraction by Simpson's biplane method is 22%. Unable to assess left ventricular diastolic function. Unable to assess left atrial pressure.   Right Ventricle The right ventricle is moderately dilated. The right ventricular systolic function is mildly reduced.   Left Atrium Mildly dilated.   Right Atrium Mildly dilated.   IVC/SVC Markedly elevated central venous pressure (10-20 mm Hg).   Mitral Valve Normal valve structure. Moderate to severe regurgitation. There is moderate mitral annular calcification.   Tricuspid Valve Normal valve structure. No stenosis. Mild regurgitation.   Aortic Valve Normal valve structure. No stenosis. No regurgitation.   Pulmonary The pulmonic valve was not seen well but no Doppler evidence of stenosis. Trace regurgitation.   Aorta The aortic root and ascending aorta are normal in size.   Pericardium Trivial pericardial effusion.     Right and left heart catheterization with coronary angiography 10/16/2021:  FINDINGS:  HEMODYNAMICS:  1. RA pressure 8 mmHg, with a V-wave of 13 mmHg.  2. RV pressure 60/17 mmHg.  3. PA pressure 59/30 mmHg with a mean PA of 40 mmHg.  4. Pulmonary capillary wedge pressure of 22 mmHg with a V-wave of 29 mmHg.  5. PA sat 66%.  RA sat 65%.  Pulse ox 99%.  6. Hemoglobin is 14.1, body surface area is 2.01.  7. Heart rate 95 beats per minute, blood pressure 140/109 mmHg.  8. Cardiac output by Fick 4.1 L/minute and by thermal 6.85 L/minute.  9. Cardiac index by Fick 2.0 L/minute per square meter and by thermal was 3.4 L/minutes per square  meter.  10. LVEDP was 15 mmHg.  No significant gradient on pressure measurements.     CORONARY ANGIOGRAPHY:  1. Coronary anatomy was right dominant.  2. Left main coronary artery has no significant disease.  3. LAD has minimal disease.  Diagonal branches have mild disease.  4. The circumflex artery has minimal disease.  Marginal branches have minimal disease.  5. Right coronary artery has minimal disease.  ?  CONCLUSIONS:    1. Elevated filling pressures, most notably on the left side with elevated pulmonary artery pressures.  2. Preserved cardiac output and cardiac index.  3. No significant coronary artery disease.    Cardiac MR 10/15/2021:  IMPRESSION     1. ?Severe LV systolic dysfunction with a calculated ejection fraction of   24%. The LV cavity is severely dilated, the end diastolic volume index was   164 mL/sq m.   2. 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 I did not   Appreciate any significant inhomogeneity on the triple inversion imaging   to suggest edema.   3. ?The RV systolic function was severely impaired with a calculated   ejection fraction of 15%. The RV cavity was mildly dilated with an   end-diastolic volume index of 107 mL/sq m.   4. ?The valvular structures appeared normal, there did appear to be mitral   valve regurgitation.   5. ?The aortic root and ascending aorta appeared normal in size.   6. ?The main pulmonary artery appeared mildly dilated.   7. ?The pulmonary venous anatomy appeared normal.   8. ?There is a trivial pericardial effusion.     Cardiovascular Health Factors  Vitals BP Readings from Last 3 Encounters:   01/14/21 (!) 144/84   11/19/20 (!) 152/106   10/21/20 127/84     Wt Readings from Last 3 Encounters:   01/14/21 101.1 kg (222 lb 12.8 oz)   11/25/20 98.4 kg (217 lb)   11/19/20 98.7 kg (217 lb 9.6 oz)     BMI Readings from Last 3 Encounters:   01/14/21 35.96 kg/m?   11/25/20 35.02 kg/m?   11/19/20 35.12 kg/m?      Smoking Social History     Tobacco Use   Smoking Status Former Smoker   ? Types: Cigarettes   Smokeless Tobacco Never Used      Lipid Profile Cholesterol   Date Value Ref Range Status   10/14/2020 181 <200 MG/DL Final     HDL   Date Value Ref Range Status   10/14/2020 49 >40 MG/DL Final     LDL   Date Value Ref Range Status   10/14/2020 120 (H) <100 mg/dL Final     Triglycerides   Date Value Ref Range Status   10/14/2020 81 <150 MG/DL Final      Blood Sugar Hemoglobin A1C   Date Value Ref Range Status   10/14/2020 6.1 (H) 4.0 - 6.0 % Final     Comment:     The ADA recommends that most patients with type 1 and type 2 diabetes maintain   an A1c level <7%.  CHECKED       Glucose   Date Value Ref Range Status   11/12/2020 94  Final   10/16/2020 105 (H) 70 - 100 MG/DL Final   45/40/9811 93 70 - 100 MG/DL Final          Problems Addressed Today  Chronic systolic and diastolic heart failure  Assessment and Plan    1) very well compensated congestive heart failure.  Alternatives for therapy were reviewed with the patient and she wanted to increase her carvedilol to 25 mg twice daily.  Possible adverse effects associated with increase in carvedilol were reviewed with the patient and she was asked to report any worrisome symptoms.  Her left ventricular systolic function had improved on her last echo Doppler study performed on 12/15/2020 with an estimated left ventricular ejection fraction of 40 to 45%.  The risks and benefits of continuing her wearable electrical defibrillator were reviewed with the patient and she wanted to discontinue use of the wearable electrical defibrillator at this time.  2) the patient has no history of coronary disease, acute coronary syndromes or peripheral vascular or coronary interventions.  She plans to stop her aspirin. Regular mild aerobic exercise, weight loss and adherence to a heart healthy diet were recommended.   I have asked her to return for follow-up in 3 months.         Current Medications (including today's revisions)  ? aspirin EC 81 mg tablet Take 81 mg by mouth daily. Take with food.   ? BIOTIN PO Take 3 tablets by mouth daily.   ? carvediloL (COREG) 12.5 mg tablet TAKE 1 & 1/2 TABLETS BY MOUTH TWICE DAILY WITH FOOD.   ? ergocalciferol (vitamin D2) (DRISDOL) 1,250 mcg (50,000 unit) capsule Take one capsule by mouth every 7 days.   ? furosemide (LASIX) 40 mg tablet Take 40 mg by mouth daily.   ? levothyroxine (SYNTHROID) 125 mcg tablet Take one tablet by mouth daily.   ? losartan (COZAAR) 50 mg tablet Take one tablet by mouth daily.   ? sertraline (ZOLOFT) 25 mg tablet Take 12.5 mg by mouth at bedtime daily.   ? spironolactone (ALDACTONE) 25 mg tablet Take one tablet by mouth daily. Take with food.

## 2021-01-14 NOTE — Progress Notes
Patient's life vest was discontinued today during her office visit with Dr. Arna Medici. [employer] was contacted and informed of discontinuation.

## 2021-04-27 ENCOUNTER — Encounter: Admit: 2021-04-27 | Discharge: 2021-04-27 | Payer: MEDICARE

## 2021-05-08 IMAGING — US ECHOCOMPL
1 series · 12 of 24 positions shown · non-contrast
Comparison: none

[Series 1: us echo 2d, complete · 68 acquisitions, 12 frames shown]
[im 3/68]
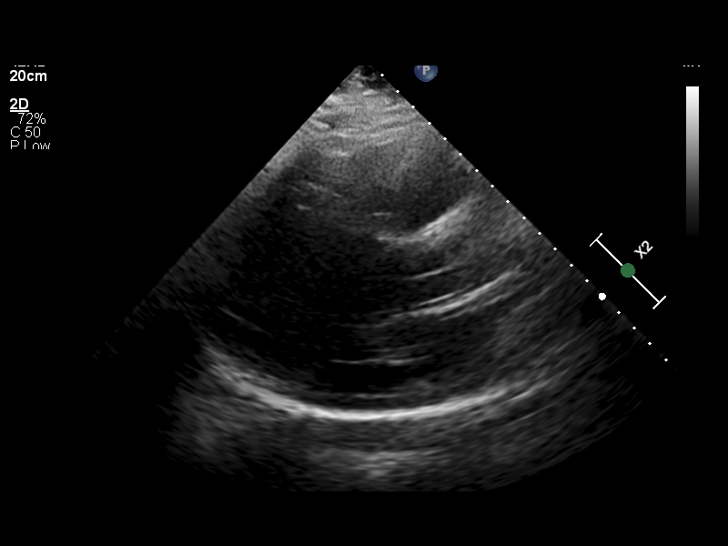
[im 6/68]
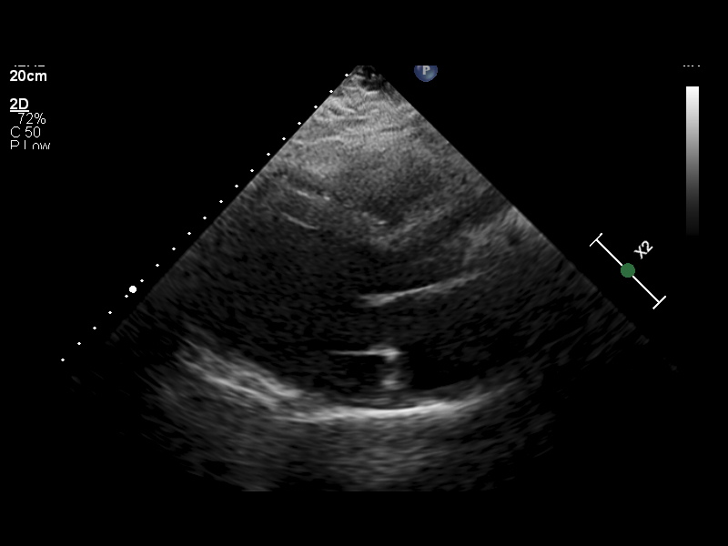
[im 12/68]
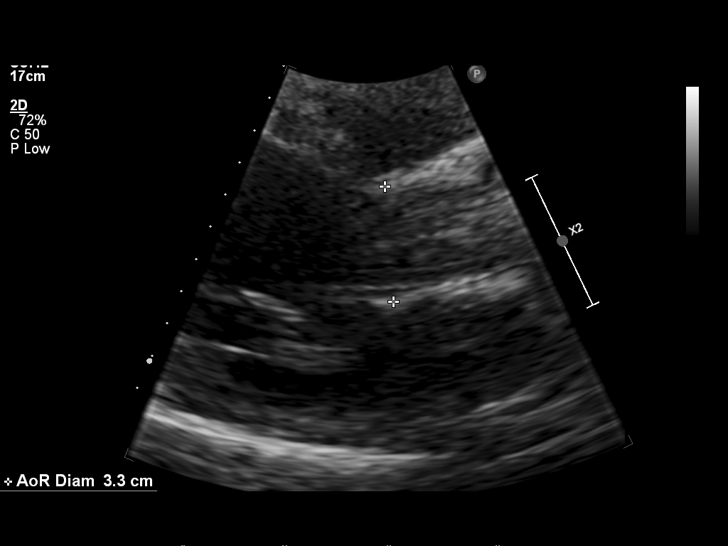
[im 21/68]
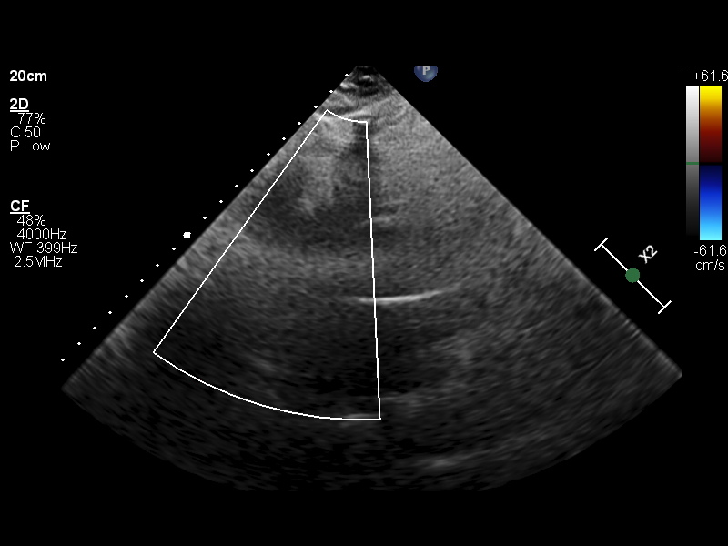
[im 27/68]
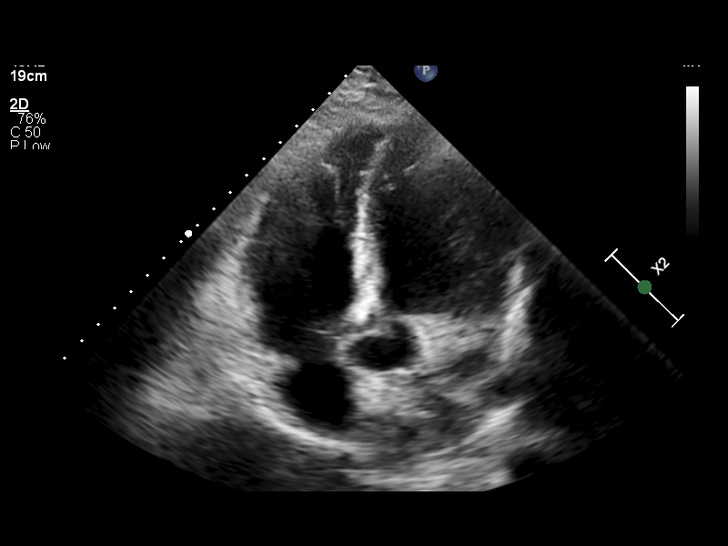
[im 30/68]
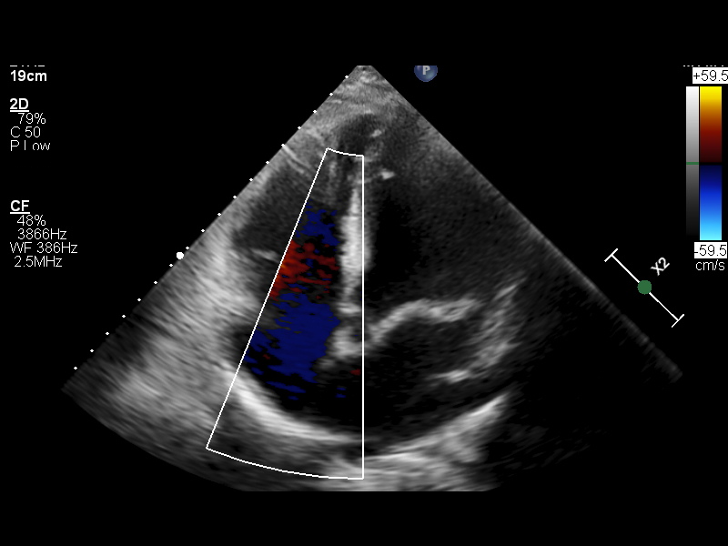
[im 35/68]
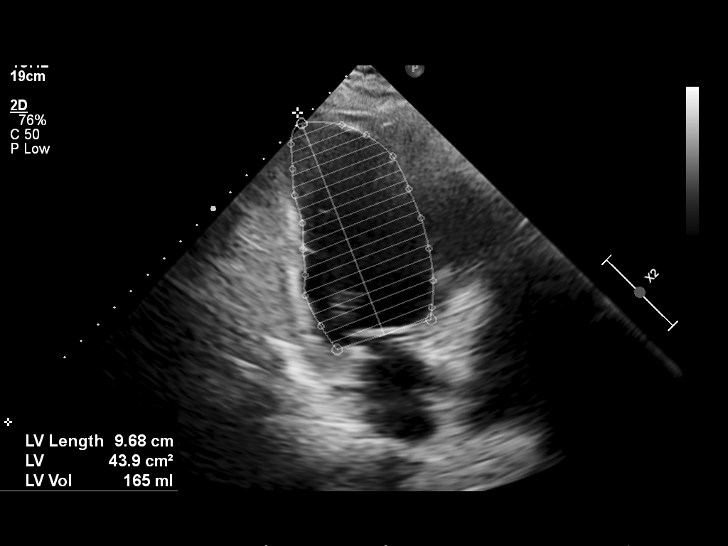
[im 44/68]
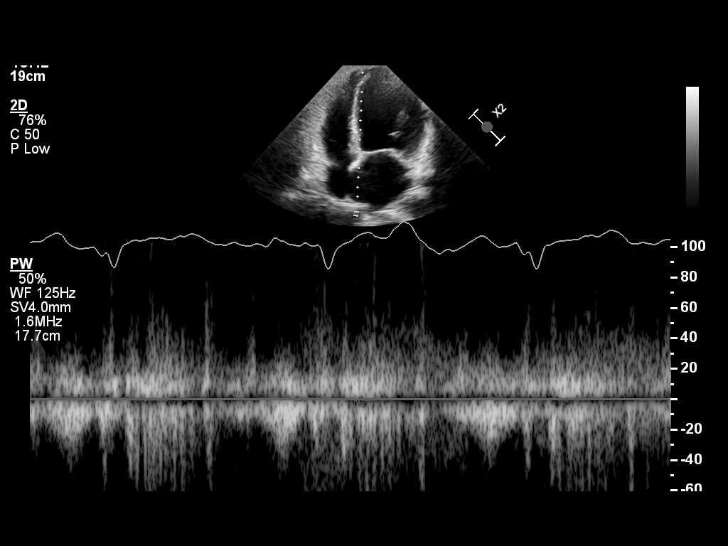
[im 50/68]
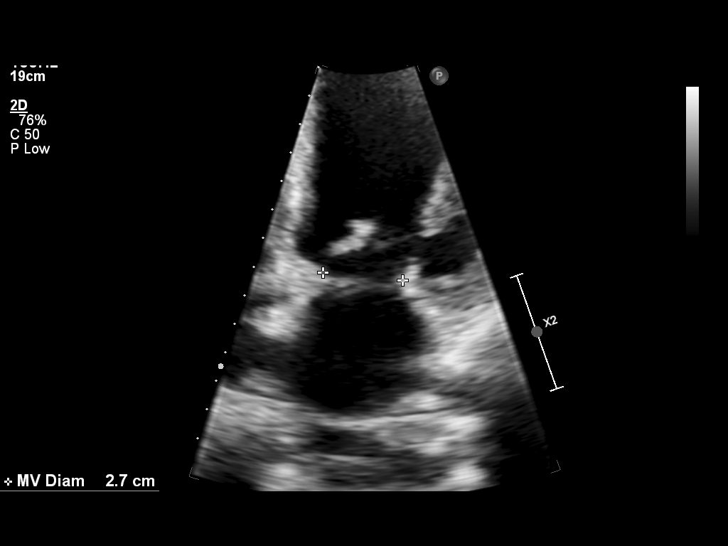
[im 56/68]
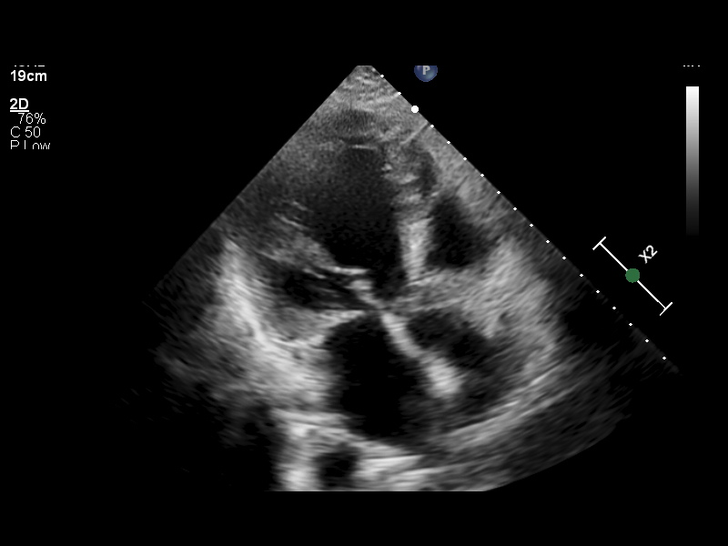
[im 65/68]
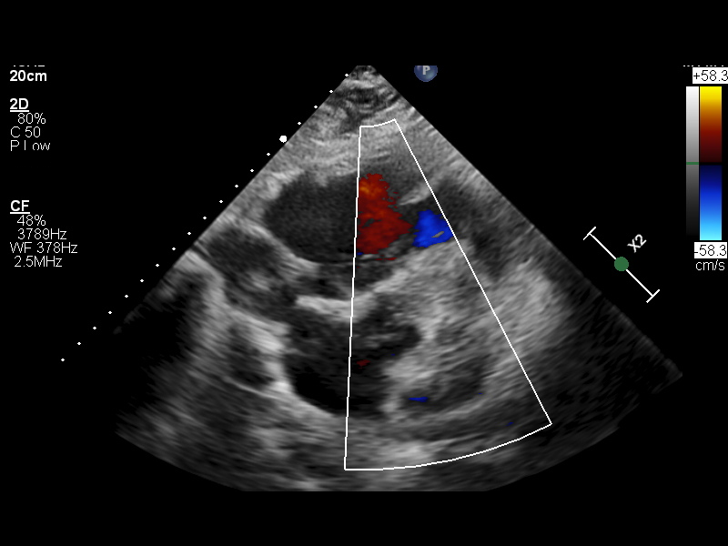
[im 68/68]
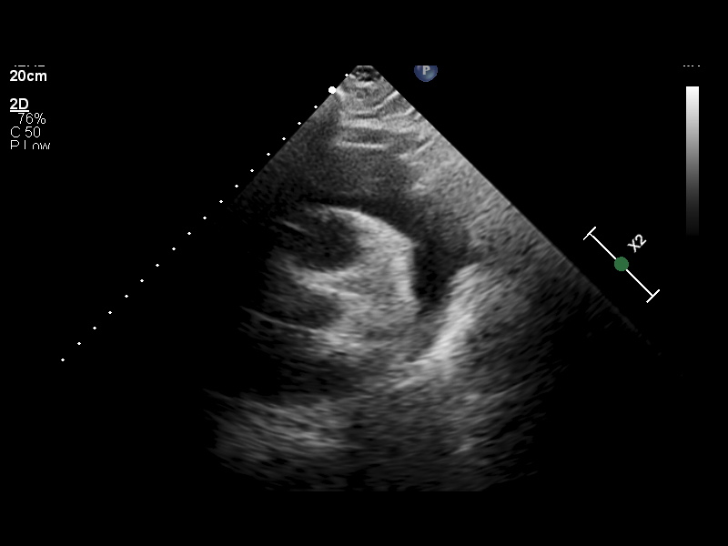

[12 of 24 positions shown; findings below may reference images not displayed]

11/25/20

11/25/20 -  2D + DOPPLER ECHO
Location Performed: [HOSPITAL]

Referring Provider:
Fellow:
[facility] of [facility]: [facility]
Sonographer: External Staff

Indications: Hypertension     Heart failure

Vitals
Height   Weight   BSA (Calculated)   BP   Comments
167.6 cm (66")   98.4 kg (217 lb)

Interpretation Summary
Moderate to severely dilated left ventricle with moderately reduced systolic function with a
visually estimated ejection fraction of  40-45%.
Diffuse hypokinesis, more profound in the inferior and inferolateral myocardial segments.
Mildly dilated right ventricular cavity with borderline RV systolic function
Indeterminate diastolic function
Mild left atrial enlargement
No Doppler evidence of significant valvular stenosis
Moderate mitral annular calcification without significant stenosis (mean gradient 3 mmHg at a heart
rate of 67 bpm)
Moderate, eccentric functional mitral insufficiency
Mild to moderate tricuspid insufficiency
No pericardial effusion.

No prior transthoracic echocardiograms available for direct visual comparison

Echocardiographic Findings
Left Ventricle   The left ventricle is moderately dilated. Hypertrophy. The left ventricular
systolic function is moderately reduced. The visually estimated ejection fraction is 40%. The
ejection fraction by Simpson's biplane method is 47%. There is diffuse hypokinesis. Cannot determine
left ventricular diastolic function. Cannot determine left atrial pressure.
Right Ventricle   The right ventricle is mildly dilated. The right ventricular systolic function is
at the lower limit of normal.
Left Atrium   Mildly dilated.
Right Atrium   Normal size.
IVC/SVC   Normal central venous pressure (0-5 mm Hg).
Mitral Valve   Non-specific thickening. No stenosis. Moderate regurgitation. There is mild mitral
annular calcification without stenosis.
Tricuspid Valve   Normal valve structure. No stenosis. Mild to moderate regurgitation.
Aortic Valve   The aortic valve was not well seen. No stenosis. No regurgitation.
Pulmonary   The pulmonic valve was not seen well but no Doppler evidence of stenosis. No
regurgitation.
The pulmonary artery was not well seen.
Aorta   The aortic root and ascending aorta are normal in size for age and body surface area
Pericardium   No pericardial effusion.

Left Ventricular Wall Scoring
Resting   Score Index: 1.000   Percent Normal: 100.0%

The left ventricular wall motion is normal.

Left Heart 2D Measurements (Normal Ranges)
EF (Visual)
40 %
EF (Simpson's)
47 %
LVIDD
7.0 cm (Range: 3.8 - 5.2)
LVIDS
5.5 cm (Range: 2.2 - 3.5)
IVS
1.0 cm (Range: 0.6 - 0.9)
LV PW
1.2 cm (Range: 0.6 - 0.9)
LA Size
4.4 cm (Range: 2.7 - 3.8)

Right Heart 2D   M-Mode Measurements (Normal Ranges) (Range)
RV Basal Dia
4.4 cm (2.5 - 4.1)
RV Mid Dia
4.2 cm (1.9 - 3.5)
KOBE
16.3 cm2 (<18)
M-Mode TAPSE
2.8 cm (>1.7)

Left Heart 2D Addnl Measurements (Normal Ranges)
LV Systolic Vol
104 mL (Range: 14 - 42)
LV Systolic Vol Index
49 mL (Range: 8 - 24)
LV Diastolic Vol
197 mL (Range: 46 - 106)
LV Diastolic Vol Index
92 mL (Range: 29 - 61)
LA Vol
76 mL (Range: 22 - 52)
LA Vol Index
35.51 (Range: 16 - 34)
LV Mass
363 g (Range: 67 - 162)
LV Mass Index
170 g/m2 (Range: 43 - 95)
RWT
0.34 (Range: <=0.42)

Aortic Root Measurements (Normal Ranges)
Sinus
3.3 cm (Range: 2.4 - 3.6)
AJIMOH ASOLUKA
3.4 cm

Doppler (Spectral and Color Flow)
Estimated Peak Systolic PA Pressure
Aortic valve area
1.87 cm2
Aortic valve mean gradient
Aortic valve peak gradient
Aortic valve peak velocity
1.4 m/s
Aortic valve velocity ratio
Mitral valve PISA EROA
3.44 cm2

Tech Notes:

## 2021-08-26 ENCOUNTER — Encounter: Admit: 2021-08-26 | Discharge: 2021-08-26 | Payer: MEDICARE

## 2021-08-26 DIAGNOSIS — R000 Tachycardia, unspecified: Secondary | ICD-10-CM

## 2021-08-26 DIAGNOSIS — E039 Hypothyroidism, unspecified: Secondary | ICD-10-CM

## 2021-08-26 DIAGNOSIS — R609 Edema, unspecified: Secondary | ICD-10-CM

## 2021-08-26 DIAGNOSIS — I10 Essential (primary) hypertension: Secondary | ICD-10-CM

## 2021-08-26 DIAGNOSIS — F419 Anxiety disorder, unspecified: Secondary | ICD-10-CM

## 2021-08-26 DIAGNOSIS — I5022 Chronic systolic (congestive) heart failure: Secondary | ICD-10-CM

## 2021-08-26 DIAGNOSIS — I5023 Acute on chronic systolic (congestive) heart failure: Secondary | ICD-10-CM

## 2021-08-26 NOTE — Progress Notes
Date of Service: 08/26/2021    Melinda Chen is a 70 y.o. female.       HPI    I saw [name] on October 13, 2020.  At that time she had decompensated congestive heart failure and was hospitalized the same day.  She underwent right and left heart catheterization and was found to have no significant coronary disease.  She was discharged on guideline directed medical therapy and LifeVest.    She has been very stable over the past 6 months. [name] left ventricular systolic function improved with medical therapy and she turned her LifeVest in a long time ago.Marland Kitchen Her congestive symptoms have been very well controlled on her present dose of diuretic and she can now walk extended distances without difficulty.  She tries to walk for at least 30 minutes every day for exercise. [name] indicates that her blood pressure is very well controlled when she checks it at home except when she is under stress.  The patient has been doing well and reports no angina, congestive symptoms, palpitations, sensation of sustained forceful heart pounding, lightheadedness or syncope.  Her exercise tolerance has improved significantly over the past 6 months.  The patient reports no myalgias, bleeding abnormalities, or strokelike symptoms. 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       Vitals:    08/26/21 1420   BP: (!) 148/98   BP Source: Arm, Left Upper   Pulse: 74   SpO2: 98%   O2 Device: None (Room air)   PainSc: Zero   Weight: 111.9 kg (246 lb 12.8 oz)   Height: 167.6 cm (5' 6)     Body mass index is 39.83 kg/m?Marland Kitchen     Past Medical History  Patient Active Problem List    Diagnosis Date Noted   ? Morbid (severe) obesity due to excess calories (HCC) 11/19/2020   ? Chronic systolic heart failure (HCC) 10/21/2020   ? Acute on chronic systolic (congestive) heart failure (HCC) 10/14/2020     10/14/20 EF 20%, mod dilated RV with mod reduced function, LVIDD 6.80 cm, RV basal diameter 5.10 cm      ? Primary hypertension 10/14/2020   ? Anxiety 10/13/2020   ? Edema 10/13/2020   ? Hypothyroid 10/13/2020   ? Class 2 obesity with body mass index (BMI) of 37.0 to 37.9 in adult 10/13/2020   ? Tachycardia 10/13/2020   ? Dyspnea 10/13/2020         Review of Systems   Constitutional: Negative.   HENT: Negative.    Eyes: Negative.    Cardiovascular: Negative.    Respiratory: Negative.    Endocrine: Negative.    Hematologic/Lymphatic: Negative.    Skin: Negative.    Musculoskeletal: Negative.    Gastrointestinal: Negative.    Genitourinary: Negative.    Neurological: Negative.    Psychiatric/Behavioral: Negative.    Allergic/Immunologic: Negative.        Physical Exam  GENERAL: The patient is well developed, well nourished, resting comfortably and in no distress.   HEENT: No abnormalities of the visible oro-nasopharynx, conjunctiva or sclera are noted.  NECK: There is no jugular venous distension. Carotids are palpable and without bruits. There is no thyroid enlargement.  Chest: Lung fields are clear to auscultation. There are no wheezes or crackles.  CV: There is a regular rhythm. The first and second heart sounds are normal. There are no murmurs, gallops or rubs.  ABD: The abdomen is soft and supple  with normal bowel sounds. There is no hepatosplenomegaly, ascites, tenderness, masses or bruits.  Neuro: There are no focal motor defects. Ambulation is normal. Cognitive function appears normal.  Ext: There is trace bipedal edema without evidence of deep vein thrombosis. Peripheral pulses are satisfactory.    SKIN: There are no rashes and no cellulitis  PSYCH: The patient is calm, rationale and oriented.    Cardiovascular Studies  A twelve-lead ECG obtained on October 16, 2020 shows normal sinus rhythm with a heart rate of 89 bpm.  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  There is evidence for left ventricular hypertrophy and left atrial enlargement.  ?  Labs from November 22, 2020 revealed serum potassium 4.5 mmol/L and serum creatinine 1.17 mg/dL.  ?  Echo Doppler study 11/25/2020:  Interpretation Summary  ?  1. Moderate to severely dilated left ventricle with moderately reduced systolic function with a visually estimated ejection fraction of ~40-45%.  2. Diffuse hypokinesis, more profound in the inferior and inferolateral myocardial segments.  3. Mildly dilated right ventricular cavity with borderline RV systolic function  4. Indeterminate diastolic function  5. Mild left atrial enlargement  6. No Doppler evidence of significant valvular stenosis  7. Moderate mitral annular calcification without significant stenosis (mean gradient 3 mmHg at a heart rate of 67 bpm)  8. Moderate, eccentric functional mitral insufficiency  9. Mild to moderate tricuspid insufficiency  10. No pericardial effusion.  ?  Initial echo Doppler study 10/14/2020:  Interpretation Summary  ?  ?  ? The left ventricular systolic function is severely reduced. The ejection fraction by Simpson's biplane method is 22%.  ? The right ventricle is moderately dilated with moderately reduced function.  ? Moderate-severe posteriorly directed likely functional mitral valve regurgitation present.  ?  Tachycardia throughout the study.   There are no prior studies available for comparison- patient has already been admitted to the hospital for futher management.   ?  ?  Echocardiographic Findings  ?  Left Ventricle The left ventricle is moderately dilated. Mild eccentric hypertrophy. The left ventricular systolic function is severely reduced. The ejection fraction by Simpson's biplane method is 22%. Unable to assess left ventricular diastolic function. Unable to assess left atrial pressure.   Right Ventricle The right ventricle is moderately dilated. The right ventricular systolic function is mildly reduced.   Left Atrium Mildly dilated.   Right Atrium Mildly dilated.   IVC/SVC Markedly elevated central venous pressure (10-20 mm Hg).   Mitral Valve Normal valve structure. Moderate to severe regurgitation. There is moderate mitral annular calcification.   Tricuspid Valve Normal valve structure. No stenosis. Mild regurgitation.   Aortic Valve Normal valve structure. No stenosis. No regurgitation.   Pulmonary The pulmonic valve was not seen well but no Doppler evidence of stenosis. Trace regurgitation.   Aorta The aortic root and ascending aorta are normal in size.   Pericardium Trivial pericardial effusion.   ?  Right and left heart catheterization with coronary angiography 10/16/2021:  FINDINGS:??HEMODYNAMICS:  1. RA pressure 8 mmHg, with a V-wave of 13 mmHg.  2. RV pressure 60/17 mmHg.  3. PA pressure 59/30 mmHg with a mean PA of 40 mmHg.  4. Pulmonary capillary wedge pressure of 22 mmHg with a V-wave of 29 mmHg.  5. PA sat 66%. ?RA sat 65%. ?Pulse ox 99%.  6. Hemoglobin is 14.1, body surface area is 2.01.  7. Heart rate 95 beats per minute, blood pressure 140/109 mmHg.  8. Cardiac  output by Fick 4.1 L/minute and by thermal 6.85 L/minute.  9. Cardiac index by Fick 2.0 L/minute per square meter and by thermal was 3.4 L/minutes per square meter.  10. LVEDP was 15 mmHg. ?No significant gradient on?pressure measurements. ??  CORONARY ANGIOGRAPHY:  1. Coronary anatomy was right dominant.  2. Left main coronary artery has no significant disease.  3. LAD has minimal disease. ?Diagonal branches have mild disease.  4. The circumflex artery has minimal disease. ?Marginal branches have minimal disease.  5. Right coronary artery has minimal disease.  ?  CONCLUSIONS:??  1. Elevated filling pressures, most notably on the left side with elevated pulmonary artery pressures.  2. Preserved cardiac output and cardiac index.  3. No significant coronary artery disease.  ?  Cardiac MR 10/15/2021:  IMPRESSION     1. ?Severe LV systolic dysfunction with a calculated ejection fraction of   24%. The LV cavity is severely dilated, the end diastolic volume index was   164 mL/sq m.   2. 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 I did not   Appreciate any significant inhomogeneity on the triple inversion imaging   to suggest edema.   3. ?The RV systolic function was severely impaired with a calculated   ejection fraction of 15%. The RV cavity was mildly dilated with an   end-diastolic volume index of 107 mL/sq m.   4. ?The valvular structures appeared normal, there did appear to be mitral   valve regurgitation.   5. ?The aortic root and ascending aorta appeared normal in size.   6. ?The main pulmonary artery appeared mildly dilated.   7. ?The pulmonary venous anatomy appeared normal.   8. ?There is a trivial pericardial effusion.     Cardiovascular Health Factors  Vitals BP Readings from Last 3 Encounters:   08/26/21 (!) 148/98   01/14/21 (!) 144/84   11/19/20 (!) 152/106     Wt Readings from Last 3 Encounters:   08/26/21 111.9 kg (246 lb 12.8 oz)   01/14/21 101.1 kg (222 lb 12.8 oz)   11/25/20 98.4 kg (217 lb)     BMI Readings from Last 3 Encounters:   08/26/21 39.83 kg/m?   01/14/21 35.96 kg/m?   11/25/20 35.02 kg/m?      Smoking Social History     Tobacco Use   Smoking Status Former Smoker   ? Types: Cigarettes   Smokeless Tobacco Never Used      Lipid Profile Cholesterol   Date Value Ref Range Status   10/14/2020 181 <200 MG/DL Final     HDL   Date Value Ref Range Status   10/14/2020 49 >40 MG/DL Final     LDL   Date Value Ref Range Status   10/14/2020 120 (H) <100 mg/dL Final     Triglycerides   Date Value Ref Range Status   10/14/2020 81 <150 MG/DL Final      Blood Sugar Hemoglobin A1C   Date Value Ref Range Status   10/14/2020 6.1 (H) 4.0 - 6.0 % Final     Comment:     The ADA recommends that most patients with type 1 and type 2 diabetes maintain   an A1c level <7%.  CHECKED       Glucose   Date Value Ref Range Status   11/12/2020 94  Final   10/16/2020 105 (H) 70 - 100 MG/DL Final   16/07/9603 93 70 -  100 MG/DL Final          Problems Addressed Today  Encounter Diagnoses   Name Primary?   ? Tachycardia Yes   ? Morbid (severe) obesity due to excess calories (HCC)    ? Acute on chronic systolic (congestive) heart failure (HCC)    ? Primary hypertension    ? Chronic systolic heart failure (HCC)        Assessment and Plan     [name] has very well compensated congestive heart failure. She indicates that her blood pressure is well controlled most the time except when she is under stress and she did not want to add to her medical regimen at the present time.  She was given a requisition to check her Chem-7 and serum magnesium. Regular mild aerobic exercise, weight loss and adherence to a heart healthy diet were recommended.  I have asked her to return for follow-up in 6 months. The total time spent during this interview and exam was 30 minutes.         Current Medications (including today's revisions)  ? carvediloL (COREG) 25 mg tablet Take one tablet by mouth twice daily with meals. Take with food.   ? furosemide (LASIX) 40 mg tablet Take 40 mg by mouth daily.   ? levothyroxine (SYNTHROID) 125 mcg tablet Take one tablet by mouth daily.   ? losartan (COZAAR) 50 mg tablet Take one tablet by mouth daily.   ? sertraline (ZOLOFT) 25 mg tablet Take 12.5 mg by mouth at bedtime daily.   ? spironolactone (ALDACTONE) 25 mg tablet Take one tablet by mouth daily. Take with food.

## 2021-10-22 ENCOUNTER — Encounter: Admit: 2021-10-22 | Discharge: 2021-10-22 | Payer: MEDICARE

## 2021-10-22 MED ORDER — LOSARTAN 50 MG PO TAB
ORAL_TABLET | Freq: Every day | ORAL | 3 refills | 90.00000 days | Status: AC
Start: 2021-10-22 — End: ?

## 2022-01-05 ENCOUNTER — Encounter: Admit: 2022-01-05 | Discharge: 2022-01-05 | Payer: MEDICARE

## 2022-01-05 MED ORDER — SPIRONOLACTONE 25 MG PO TAB
25 mg | ORAL_TABLET | Freq: Every day | ORAL | 3 refills | 90.00000 days | Status: AC
Start: 2022-01-05 — End: ?

## 2022-01-16 ENCOUNTER — Encounter: Admit: 2022-01-16 | Discharge: 2022-01-16 | Payer: MEDICARE

## 2022-01-16 MED ORDER — CARVEDILOL 25 MG PO TAB
25 mg | ORAL_TABLET | Freq: Two times a day (BID) | ORAL | 3 refills
Start: 2022-01-16 — End: ?

## 2022-07-15 ENCOUNTER — Encounter: Admit: 2022-07-15 | Discharge: 2022-07-15 | Payer: MEDICARE

## 2022-07-15 MED ORDER — CARVEDILOL 25 MG PO TAB
25 mg | ORAL_TABLET | Freq: Two times a day (BID) | ORAL | 0 refills | 90.00000 days | Status: AC
Start: 2022-07-15 — End: ?

## 2022-08-29 ENCOUNTER — Encounter: Admit: 2022-08-29 | Discharge: 2022-08-29 | Payer: MEDICARE

## 2022-08-29 DIAGNOSIS — I5022 Chronic systolic (congestive) heart failure: Secondary | ICD-10-CM

## 2022-08-29 DIAGNOSIS — I10 Essential (primary) hypertension: Secondary | ICD-10-CM

## 2022-08-29 DIAGNOSIS — R0609 Other forms of dyspnea: Secondary | ICD-10-CM

## 2022-08-29 DIAGNOSIS — I5023 Acute on chronic systolic (congestive) heart failure: Secondary | ICD-10-CM

## 2022-08-29 DIAGNOSIS — R000 Tachycardia, unspecified: Secondary | ICD-10-CM

## 2022-08-29 DIAGNOSIS — R609 Edema, unspecified: Secondary | ICD-10-CM

## 2022-08-29 NOTE — Progress Notes
Records Request    Medical records request for continuation of care:    Melinda Chen, Melinda Chen.   DOB:  1950-12-08    Patient has appointment on 09/13/2022   with  Dr. Mickel Duhamel* .      **Please fax records to [facility] [place]      Request records: STAT                Recent Labs (CMP, Lipid Panel)                      Thank you,      Cardiovascular Medicine  [facility]  [address] [place] [address]  Phone:  [phone]  Fax:  [phone]

## 2022-08-29 NOTE — Progress Notes
Received fax from patient's PCP/ [place], [place]. Patient has not had labs at this facility since November 2021. More recent lab results documented in patient's Epic/O2 chart.

## 2022-09-09 ENCOUNTER — Encounter: Admit: 2022-09-09 | Discharge: 2022-09-09 | Payer: MEDICARE

## 2022-09-09 DIAGNOSIS — I5023 Acute on chronic systolic (congestive) heart failure: Secondary | ICD-10-CM

## 2022-09-09 DIAGNOSIS — I5022 Chronic systolic (congestive) heart failure: Secondary | ICD-10-CM

## 2022-09-09 DIAGNOSIS — I10 Essential (primary) hypertension: Secondary | ICD-10-CM

## 2022-09-09 DIAGNOSIS — R0609 Other forms of dyspnea: Secondary | ICD-10-CM

## 2022-09-09 DIAGNOSIS — R7989 Other specified abnormal findings of blood chemistry: Secondary | ICD-10-CM

## 2022-09-09 DIAGNOSIS — R609 Edema, unspecified: Secondary | ICD-10-CM

## 2022-09-09 DIAGNOSIS — R000 Tachycardia, unspecified: Secondary | ICD-10-CM

## 2022-09-09 LAB — MAGNESIUM: MAGNESIUM: 2.1

## 2022-09-09 MED ORDER — FUROSEMIDE 40 MG PO TAB
40 mg | ORAL_TABLET | ORAL | 0 refills | 90.00000 days | Status: AC
Start: 2022-09-09 — End: ?

## 2022-09-09 NOTE — Telephone Encounter
Discussed lab results and medication change. Patient reports she is not having any heart failure symptoms at this time. Recommendations to change lasix 40 mg to every other day from Dr. Arna Medici given to patient. Patient to have repeat labs in one month. Patient verbalizes understanding and will keep appointment with [facility] next week.

## 2022-09-09 NOTE — Telephone Encounter
-----   Message from Hester Mates, MD sent at 09/09/2022  2:07 PM CST -----  To all: The elevated serum creatinine is likely to be a reflection of her furosemide.  If her heart failure symptoms are well compensated she can reduce her furosemide to 40 mg every other day.  Please ask her to avoid ibuprofen and other nonsteroidal anti-inflammatory medications.  Please recheck her Chem-7 again in 1 month.  Thanks.  [facility]  ----- Message -----  From: Lauralee Evener, RN  Sent: 09/09/2022   1:55 PM CST  To: Hester Mates, MD    Patient's creat is elevated at 1.32. Patient scheduled for office visit with you on 11/21. Do you have any recommendations prior to appointment?

## 2022-09-13 ENCOUNTER — Encounter: Admit: 2022-09-13 | Discharge: 2022-09-13 | Payer: MEDICARE

## 2022-09-13 DIAGNOSIS — R609 Edema, unspecified: Secondary | ICD-10-CM

## 2022-09-13 DIAGNOSIS — Z136 Encounter for screening for cardiovascular disorders: Secondary | ICD-10-CM

## 2022-09-13 DIAGNOSIS — I5022 Chronic systolic (congestive) heart failure: Secondary | ICD-10-CM

## 2022-09-13 DIAGNOSIS — F419 Anxiety disorder, unspecified: Secondary | ICD-10-CM

## 2022-09-13 DIAGNOSIS — R000 Tachycardia, unspecified: Secondary | ICD-10-CM

## 2022-09-13 DIAGNOSIS — E039 Hypothyroidism, unspecified: Secondary | ICD-10-CM

## 2022-09-13 MED ORDER — FUROSEMIDE 40 MG PO TAB
40 mg | ORAL_TABLET | Freq: Every day | ORAL | 1 refills | 90.00000 days | Status: AC | PRN
Start: 2022-09-13 — End: ?

## 2022-09-13 MED ORDER — HYDROCHLOROTHIAZIDE 25 MG PO TAB
25 mg | ORAL_TABLET | Freq: Every morning | ORAL | 1 refills | 28.00000 days | Status: AC
Start: 2022-09-13 — End: ?

## 2022-09-13 NOTE — Progress Notes
Date of Service: 09/13/2022    Melinda Chen is a 71 y.o. female.       HPI    ?Melinda Chen has been followed for heart failure with reduced ejection fraction.  Her congestive symptoms have been very well controlled on her present treatment and she can now walk extended distances without difficulty.  She has reduced her furosemide to every other day. ?She tries to walk for at least 30 minutes every day for exercise. Melinda Chen indicates that her blood pressure has been elevated when checked at home.  Usually she notices systolic blood pressures between 130 and 150 mmHg. The patient has been doing well and reports no angina, congestive symptoms, palpitations, sensation of sustained forceful heart pounding, lightheadedness or syncope.??Her exercise tolerance has been quite stable. ?The patient reports no myalgias, bleeding abnormalities, or strokelike symptoms. 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   Historically, Melinda Chen was hospitalized on October 13, 2020 for decompensated congestive heart failure. ?She underwent right and left heart catheterization and was found to have no significant coronary disease. ?She was discharged on guideline directed medical therapy which led to improvement in left ventricular systolic function.        Vitals:    09/13/22 1418 09/13/22 1504   BP: (!) 180/120 (!) 195/101   BP Source: Arm, Right Upper Arm, Right Upper   Pulse: 69    SpO2: 96%    O2 Device: None (Room air)    PainSc: Zero    Weight: 116.3 kg (256 lb 6.4 oz)    Height: 167.6 cm (5' 6)      Body mass index is 41.38 kg/m?Marland Kitchen     Past Medical History  Patient Active Problem List    Diagnosis Date Noted   ? Morbid (severe) obesity due to excess calories (HCC) 11/19/2020   ? Chronic systolic heart failure (HCC) 10/21/2020   ? Acute on chronic systolic (congestive) heart failure (HCC) 10/14/2020     10/14/20 EF 20%, mod dilated RV with mod reduced function, LVIDD 6.80 cm, RV basal diameter 5.10 cm ? Primary hypertension 10/14/2020   ? Anxiety 10/13/2020   ? Edema 10/13/2020   ? Hypothyroid 10/13/2020   ? Class 2 obesity with body mass index (BMI) of 37.0 to 37.9 in adult 10/13/2020   ? Tachycardia 10/13/2020   ? Dyspnea 10/13/2020         Review of Systems   Constitutional: Negative.   HENT: Negative.    Eyes: Negative.    Cardiovascular: Positive for dyspnea on exertion.   Respiratory: Negative.    Endocrine: Negative.    Hematologic/Lymphatic: Negative.    Skin: Negative.    Musculoskeletal: Positive for muscle cramps.   Gastrointestinal: Negative.    Genitourinary: Negative.    Neurological: Negative.    Psychiatric/Behavioral: Negative.    Allergic/Immunologic: Negative.        Physical Exam  GENERAL: The patient is well developed, well nourished, resting comfortably and in no distress.   HEENT: No abnormalities of the visible oro-nasopharynx, conjunctiva or sclera are noted.  NECK: There is no jugular venous distension. Carotids are palpable and without bruits. There is no thyroid enlargement.  Chest: Lung fields are clear to auscultation. There are no wheezes or crackles.  CV: There is a regular rhythm. The first and second heart sounds are normal. There are no murmurs, gallops or rubs.  ABD: The abdomen is soft and supple with normal bowel sounds. There is no  hepatosplenomegaly, ascites, tenderness, masses or bruits.  Neuro: There are no focal motor defects. Ambulation is normal. Cognitive function appears normal.  Ext:?There is trace bipedal edema without evidence of deep vein thrombosis. Peripheral pulses are satisfactory. ?  SKIN:?There are no rashes and no cellulitis  PSYCH:?The patient is calm, rationale and oriented.    Cardiovascular Studies    Twelve-lead ECG obtained on 09/13/2022 reveals normal sinus rhythm with a heart rate of 62 bpm.  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  Echo Doppler study 11/25/2020:  Interpretation Summary  ?  1. Moderate to severely dilated left ventricle with moderately reduced systolic function with a visually estimated ejection fraction of ~40-45%.  2. Diffuse hypokinesis, more profound in the inferior and inferolateral myocardial segments.  3. Mildly dilated right ventricular cavity with borderline RV systolic function  4. Indeterminate diastolic function  5. Mild left atrial enlargement  6. No Doppler evidence of significant valvular stenosis  7. Moderate mitral annular calcification without significant stenosis (mean gradient 3 mmHg at a heart rate of 67 bpm)  8. Moderate, eccentric functional mitral insufficiency  9. Mild to moderate tricuspid insufficiency  10. No pericardial effusion.  ?  Initial echo Doppler study 10/14/2020:  Interpretation Summary  ?  ?  ? The left ventricular systolic function is severely reduced. The ejection fraction by Simpson's biplane method is 22%.  ? The right ventricle is moderately dilated with moderately reduced function.  ? Moderate-severe posteriorly directed likely functional mitral valve regurgitation present.  ?  Tachycardia throughout the study.   There are no prior studies available for comparison- patient has already been admitted to the hospital for futher management.   ?  ?  Echocardiographic Findings  ?  Left Ventricle The left ventricle is moderately dilated. Mild eccentric hypertrophy. The left ventricular systolic function is severely reduced. The ejection fraction by Simpson's biplane method is 22%. Unable to assess left ventricular diastolic function. Unable to assess left atrial pressure.   Right Ventricle The right ventricle is moderately dilated. The right ventricular systolic function is mildly reduced.   Left Atrium Mildly dilated.   Right Atrium Mildly dilated.   IVC/SVC Markedly elevated central venous pressure (10-20 mm Hg).   Mitral Valve Normal valve structure. Moderate to severe regurgitation. There is moderate mitral annular calcification.   Tricuspid Valve Normal valve structure. No stenosis. Mild regurgitation.   Aortic Valve Normal valve structure. No stenosis. No regurgitation.   Pulmonary The pulmonic valve was not seen well but no Doppler evidence of stenosis. Trace regurgitation.   Aorta The aortic root and ascending aorta are normal in size.   Pericardium Trivial pericardial effusion.   ?  Right and left heart catheterization with coronary angiography 10/16/2021:  FINDINGS:??HEMODYNAMICS:  1. RA pressure 8 mmHg, with a V-wave of 13 mmHg.  2. RV pressure 60/17 mmHg.  3. PA pressure 59/30 mmHg with a mean PA of 40 mmHg.  4. Pulmonary capillary wedge pressure of 22 mmHg with a V-wave of 29 mmHg.  5. PA sat 66%. ?RA sat 65%. ?Pulse ox 99%.  6. Hemoglobin is 14.1, body surface area is 2.01.  7. Heart rate 95 beats per minute, blood pressure 140/109 mmHg.  8. Cardiac output by Fick 4.1 L/minute and by thermal 6.85 L/minute.  9. Cardiac index by Fick 2.0 L/minute per square meter and by thermal was 3.4 L/minutes per square meter.  10. LVEDP was 15 mmHg. ?No significant gradient on?pressure measurements. ??  CORONARY  ANGIOGRAPHY:  1. Coronary anatomy was right dominant.  2. Left main coronary artery has no significant disease.  3. LAD has minimal disease. ?Diagonal branches have mild disease.  4. The circumflex artery has minimal disease. ?Marginal branches have minimal disease.  5. Right coronary artery has minimal disease.  ?  CONCLUSIONS:??  1. Elevated filling pressures, most notably on the left side with elevated pulmonary artery pressures.  2. Preserved cardiac output and cardiac index.  3. No significant coronary artery disease.  ?  Cardiac MR 10/15/2021:  IMPRESSION     1. ?Severe LV systolic dysfunction with a calculated ejection fraction of   24%. The LV cavity is severely dilated, the end diastolic volume index was   164 mL/sq m.   2. 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 I did not Appreciate any significant inhomogeneity on the triple inversion imaging   to suggest edema.   3. ?The RV systolic function was severely impaired with a calculated   ejection fraction of 15%. The RV cavity was mildly dilated with an   end-diastolic volume index of 107 mL/sq m.   4. ?The valvular structures appeared normal, there did appear to be mitral   valve regurgitation.   5. ?The aortic root and ascending aorta appeared normal in size.   6. ?The main pulmonary artery appeared mildly dilated.   7. ?The pulmonary venous anatomy appeared normal.   8. ?There is a trivial pericardial effusion.?      Cardiovascular Health Factors  Vitals BP Readings from Last 3 Encounters:   09/13/22 (!) 195/101   08/26/21 (!) 148/98   01/14/21 (!) 144/84     Wt Readings from Last 3 Encounters:   09/13/22 116.3 kg (256 lb 6.4 oz)   08/26/21 111.9 kg (246 lb 12.8 oz)   01/14/21 101.1 kg (222 lb 12.8 oz)     BMI Readings from Last 3 Encounters:   09/13/22 41.38 kg/m?   08/26/21 39.83 kg/m?   01/14/21 35.96 kg/m?      Smoking Social History     Tobacco Use   Smoking Status Former   ? Types: Cigarettes   Smokeless Tobacco Never      Lipid Profile Cholesterol   Date Value Ref Range Status   10/14/2020 181 <200 MG/DL Final     HDL   Date Value Ref Range Status   10/14/2020 49 >40 MG/DL Final     LDL   Date Value Ref Range Status   10/14/2020 120 (H) <100 mg/dL Final     Triglycerides   Date Value Ref Range Status   10/14/2020 81 <150 MG/DL Final      Blood Sugar Hemoglobin A1C   Date Value Ref Range Status   10/14/2020 6.1 (H) 4.0 - 6.0 % Final     Comment:     The ADA recommends that most patients with type 1 and type 2 diabetes maintain   an A1c level <7%.  CHECKED       Glucose   Date Value Ref Range Status   09/09/2022 90  Final   11/12/2020 94  Final   10/16/2020 105 (H) 70 - 100 MG/DL Final          Problems Addressed Today  Encounter Diagnoses   Name Primary?   ? Screening for heart disease Yes   ? Chronic systolic heart failure (HCC)    ? Morbid (severe) obesity due to excess  calories (HCC)        Assessment and Plan     Ms. Terry's heart failure appears very well controlled.  Her blood pressure is elevated.  Various alternatives for the treatment of hypertension were reviewed with the patient.  She wanted to stop furosemide and just use it as necessary.  She wanted to start hydrochlorothiazide 25 mg daily.  If this is not successful and controlling her blood pressure the next steps would be to switch her losartan to [facility] and then to consider adding Jardiance.  She preferred switching her furosemide to hydrochlorothiazide and wanted the most affordable option due to the cost of medications.  I have asked her to report her blood pressure readings within the next month.  I have asked her to obtain a Chem-7 within the next month.  I have asked her to return for follow-up in 6 months to follow her progress. The total time spent during this interview and exam with preparation and chart review was 30 minutes.           Current Medications (including today's revisions)  ? carvediloL (COREG) 25 mg tablet TAKE ONE TABLET BY MOUTH TWICE DAILY WITH MEALS. TAKE WITH FOOD.   ? furosemide (LASIX) 40 mg tablet Take one tablet by mouth daily as needed.   ? hydroCHLOROthiazide (HYDRODIURIL) 25 mg tablet Take one tablet by mouth every morning.   ? levothyroxine (SYNTHROID) 125 mcg tablet Take one tablet by mouth daily.   ? losartan (COZAAR) 50 mg tablet TAKE 1 TABLET BY MOUTH DAILY   ? spironolactone (ALDACTONE) 25 mg tablet TAKE ONE TABLET BY MOUTH DAILY. TAKE WITH FOOD.

## 2022-09-13 NOTE — Patient Instructions
Thank you for visiting our office today.    We would like to make the following medication adjustments:      Hydrochlorothiazide 25mg  daily    Lasix as needed     Check labs in 1 month     Call with blood pressure readings in 1 month      Otherwise continue the same medications as you have been doing.          We will be pursuing the following tests after your appointment today:       Orders Placed This Encounter    ECG 12-LEAD    hydroCHLOROthiazide (HYDRODIURIL) 25 mg tablet    furosemide (LASIX) 40 mg tablet         We will plan to see you back in 6 months.  Please call in the meantime with any questions or concerns.        Please allow 5-7 business days for our providers to review your results. All normal results will go to MyChart. If you do not have Mychart, it is strongly recommended to get this so you can easily view all your results. If you do not have mychart, we will attempt to call you once with normal lab and testing results. If we cannot reach you by phone with normal results, we will send you a letter.  If you have not heard the results of your testing after one week please give [place] a call.       Your [facility] [place] Team Gabriel Rung, Brett Canales, Pilar Jarvis, and [place])  phone number is [phone].

## 2022-10-12 ENCOUNTER — Encounter: Admit: 2022-10-12 | Discharge: 2022-10-12 | Payer: MEDICARE

## 2022-10-12 MED ORDER — CARVEDILOL 25 MG PO TAB
25 mg | ORAL_TABLET | Freq: Two times a day (BID) | ORAL | 3 refills | 90.00000 days | Status: AC
Start: 2022-10-12 — End: ?

## 2022-10-21 ENCOUNTER — Encounter: Admit: 2022-10-21 | Discharge: 2022-10-21 | Payer: MEDICARE

## 2022-10-21 NOTE — Telephone Encounter
10/21/2022 1:54 PM   Patient left voicemail stating she had a month of blood pressure readings to report. Attempted to reach patient, left voicemail requesting a return or My Chart message.  Colvin Caroli, LPN

## 2022-10-23 ENCOUNTER — Encounter: Admit: 2022-10-23 | Discharge: 2022-10-23 | Payer: MEDICARE

## 2022-10-23 MED ORDER — LOSARTAN 50 MG PO TAB
ORAL_TABLET | 3 refills
Start: 2022-10-23 — End: ?

## 2022-10-25 ENCOUNTER — Encounter: Admit: 2022-10-25 | Discharge: 2022-10-25 | Payer: MEDICARE

## 2023-01-05 ENCOUNTER — Encounter: Admit: 2023-01-05 | Discharge: 2023-01-05 | Payer: MEDICARE

## 2023-01-05 MED ORDER — SPIRONOLACTONE 25 MG PO TAB
25 mg | ORAL_TABLET | Freq: Every day | ORAL | 3 refills | 90.00000 days | Status: AC
Start: 2023-01-05 — End: ?

## 2023-03-08 ENCOUNTER — Encounter: Admit: 2023-03-08 | Discharge: 2023-03-08 | Payer: MEDICARE

## 2023-03-08 MED ORDER — HYDROCHLOROTHIAZIDE 25 MG PO TAB
25 mg | ORAL_TABLET | ORAL | 3 refills | 28.00000 days | Status: AC
Start: 2023-03-08 — End: ?

## 2023-04-20 ENCOUNTER — Encounter: Admit: 2023-04-20 | Discharge: 2023-04-20 | Payer: MEDICARE

## 2023-04-20 DIAGNOSIS — I5023 Acute on chronic systolic (congestive) heart failure: Secondary | ICD-10-CM

## 2023-04-20 DIAGNOSIS — I5022 Chronic systolic (congestive) heart failure: Secondary | ICD-10-CM

## 2023-04-20 DIAGNOSIS — R000 Tachycardia, unspecified: Secondary | ICD-10-CM

## 2023-04-20 DIAGNOSIS — I509 Heart failure, unspecified: Secondary | ICD-10-CM

## 2023-04-20 DIAGNOSIS — F419 Anxiety disorder, unspecified: Secondary | ICD-10-CM

## 2023-04-20 DIAGNOSIS — R609 Edema, unspecified: Secondary | ICD-10-CM

## 2023-04-20 DIAGNOSIS — I10 Essential (primary) hypertension: Secondary | ICD-10-CM

## 2023-04-20 DIAGNOSIS — R7989 Other specified abnormal findings of blood chemistry: Secondary | ICD-10-CM

## 2023-04-20 DIAGNOSIS — E039 Hypothyroidism, unspecified: Secondary | ICD-10-CM

## 2023-04-20 LAB — BASIC METABOLIC PANEL
ANION GAP: 10 K/UL
BLD UREA NITROGEN: 25 % — ABNORMAL HIGH (ref 9.8–20.1)
CALCIUM: 9.4 K/UL
CHLORIDE: 103 % — ABNORMAL HIGH (ref 35–71)
CO2: 26 % (ref 4–12)
CREATININE: 1.3 % — ABNORMAL HIGH (ref 0.57–1.11)
GFR ESTIMATED: 42 K/UL — ABNORMAL LOW (ref >59)
GLUCOSE,PANEL: 108 K/UL — ABNORMAL HIGH (ref 70–105)
POTASSIUM: 4.5 % — ABNORMAL HIGH (ref >60)
SODIUM: 139 FL (ref 7–11)

## 2023-04-20 NOTE — Progress Notes
Date of Service: 04/20/2023    Melinda Chen is a 72 y.o. female.       HPI   Melinda Chen has been followed for heart failure with mildly reduced ejection fraction.  Her congestive symptoms have been very well controlled on her present treatment and she can now walk extended distances without difficulty.  She believes that she has normal exercise tolerance.  She only has to take furosemide infrequently perhaps 3-4 times a month..  She tries to walk for at least 30 minutes every day for exercise. Melinda Chen indicates that her blood pressure has been very well-controlled when checked at home recently.  For example, her blood pressure was 116/75 mmHg on 04/19/2023, 117/79 mmHg on 04/18/2023, 125/82 on 04/17/2023, 124/70 on 04/16/2023, 116/75 mmHg on 04/15/2023, 114/73 mmHg on 04/14/2023 and 102/65 mmHg on 04/13/2023.   The patient has been doing well and reports no angina, congestive symptoms, palpitations, sensation of sustained forceful heart pounding, lightheadedness or syncope.  Her exercise tolerance has been quite stable.  The patient reports no myalgias, bleeding abnormalities, or strokelike symptoms. 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   Historically, Melinda Chen was hospitalized on October 13, 2020 for decompensated congestive heart failure.  She underwent right and left heart catheterization and was found to have no significant coronary disease.  She was discharged on guideline directed medical therapy which led to improvement in left ventricular systolic function.          Vitals:    04/20/23 1242   BP: (!) 166/99   BP Source: Arm, Left Lower   Pulse: 70   SpO2: 94%   O2 Device: None (Room air)   PainSc: Zero   Weight: 121.2 kg (267 lb 3.2 oz)   Height: 167.6 cm (5' 6)     Body mass index is 43.13 kg/m?Marland Kitchen     Past Medical History  Patient Active Problem List    Diagnosis Date Noted    Morbid (severe) obesity due to excess calories (HCC) 11/19/2020    Chronic systolic heart failure (HCC) 10/21/2020    Acute on chronic systolic (congestive) heart failure (HCC) 10/14/2020     10/14/20 EF 20%, mod dilated RV with mod reduced function, LVIDD 6.80 cm, RV basal diameter 5.10 cm       Primary hypertension 10/14/2020    Anxiety 10/13/2020    Edema 10/13/2020    Hypothyroid 10/13/2020    Class 2 obesity with body mass index (BMI) of 37.0 to 37.9 in adult 10/13/2020    Tachycardia 10/13/2020    Dyspnea 10/13/2020         Review of Systems   Constitutional: Negative.   HENT: Negative.     Eyes: Negative.    Cardiovascular: Negative.    Respiratory: Negative.     Endocrine: Negative.    Hematologic/Lymphatic: Negative.    Skin: Negative.    Musculoskeletal: Negative.    Gastrointestinal: Negative.    Genitourinary: Negative.    Neurological: Negative.    Psychiatric/Behavioral: Negative.     Allergic/Immunologic: Negative.        Physical Exam  GENERAL: The patient is well developed, well nourished, resting comfortably and in no distress.   HEENT: No abnormalities of the visible oro-nasopharynx, conjunctiva or sclera are noted.  NECK: There is no jugular venous distension. Carotids are palpable and without bruits. There is no thyroid enlargement.  Chest: Lung fields are clear to auscultation. There are no wheezes or crackles.  CV: There is a regular rhythm. The first and second heart sounds are normal. There are no murmurs, gallops or rubs.  ABD: The abdomen is soft and supple with normal bowel sounds. There is no hepatosplenomegaly, ascites, tenderness, masses or bruits.  Neuro: There are no focal motor defects. Ambulation is normal. Cognitive function appears normal.  Ext: There is trace bipedal edema without evidence of deep vein thrombosis. Peripheral pulses are satisfactory.    SKIN: There are no rashes and no cellulitis  PSYCH: The patient is calm, rationale and oriented.    Cardiovascular Studies  Twelve-lead ECG obtained on 09/13/2022 reveals normal sinus rhythm with a heart rate of 62 bpm.  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  Echo Doppler study 11/25/2020:  Interpretation Summary     Moderate to severely dilated left ventricle with moderately reduced systolic function with a visually estimated ejection fraction of ~40-45%.  Diffuse hypokinesis, more profound in the inferior and inferolateral myocardial segments.  Mildly dilated right ventricular cavity with borderline RV systolic function  Indeterminate diastolic function  Mild left atrial enlargement  No Doppler evidence of significant valvular stenosis  Moderate mitral annular calcification without significant stenosis (mean gradient 3 mmHg at a heart rate of 67 bpm)  Moderate, eccentric functional mitral insufficiency  Mild to moderate tricuspid insufficiency  No pericardial effusion.     Initial echo Doppler study 10/14/2020:  Interpretation Summary        The left ventricular systolic function is severely reduced. The ejection fraction by Simpson's biplane method is 22%.  The right ventricle is moderately dilated with moderately reduced function.  Moderate-severe posteriorly directed likely functional mitral valve regurgitation present.     Tachycardia throughout the study.   There are no prior studies available for comparison- patient has already been admitted to the hospital for futher management.         Echocardiographic Findings     Left Ventricle The left ventricle is moderately dilated. Mild eccentric hypertrophy. The left ventricular systolic function is severely reduced. The ejection fraction by Simpson's biplane method is 22%. Unable to assess left ventricular diastolic function. Unable to assess left atrial pressure.   Right Ventricle The right ventricle is moderately dilated. The right ventricular systolic function is mildly reduced.   Left Atrium Mildly dilated.   Right Atrium Mildly dilated.   IVC/SVC Markedly elevated central venous pressure (10-20 mm Hg).   Mitral Valve Normal valve structure. Moderate to severe regurgitation. There is moderate mitral annular calcification.   Tricuspid Valve Normal valve structure. No stenosis. Mild regurgitation.   Aortic Valve Normal valve structure. No stenosis. No regurgitation.   Pulmonary The pulmonic valve was not seen well but no Doppler evidence of stenosis. Trace regurgitation.   Aorta The aortic root and ascending aorta are normal in size.   Pericardium Trivial pericardial effusion.      Right and left heart catheterization with coronary angiography 10/16/2021:  FINDINGS:  HEMODYNAMICS:  RA pressure 8 mmHg, with a V-wave of 13 mmHg.  RV pressure 60/17 mmHg.  PA pressure 59/30 mmHg with a mean PA of 40 mmHg.  Pulmonary capillary wedge pressure of 22 mmHg with a V-wave of 29 mmHg.  PA sat 66%.  RA sat 65%.  Pulse ox 99%.  Hemoglobin is 14.1, body surface area is 2.01.  Heart rate 95 beats per minute, blood pressure 140/109 mmHg.  Cardiac output by Fick 4.1 L/minute and by thermal 6.85 L/minute.  Cardiac index by  Fick 2.0 L/minute per square meter and by thermal was 3.4 L/minutes per square meter.  LVEDP was 15 mmHg.  No significant gradient on pressure measurements.     CORONARY ANGIOGRAPHY:  Coronary anatomy was right dominant.  Left main coronary artery has no significant disease.  LAD has minimal disease.  Diagonal branches have mild disease.  The circumflex artery has minimal disease.  Marginal branches have minimal disease.  Right coronary artery has minimal disease.     CONCLUSIONS:    Elevated filling pressures, most notably on the left side with elevated pulmonary artery pressures.  Preserved cardiac output and cardiac index.  No significant coronary artery disease.     Cardiac MR 10/15/2021:  IMPRESSION     1.  Severe LV systolic dysfunction with a calculated ejection fraction of   24%. The LV cavity is severely dilated, the end diastolic volume index was   164 mL/sq m.   2.  The delayed hyperenhancement imaging did show mid wall delayed   hyperenhancement consistent with a dilated cardiomyopathy. I did not   Appreciate any significant inhomogeneity on the triple inversion imaging   to suggest edema.   3.  The RV systolic function was severely impaired with a calculated   ejection fraction of 15%. The RV cavity was mildly dilated with an   end-diastolic volume index of 107 mL/sq m.   4.  The valvular structures appeared normal, there did appear to be mitral   valve regurgitation.   5.  The aortic root and ascending aorta appeared normal in size.   6.  The main pulmonary artery appeared mildly dilated.   7.  The pulmonary venous anatomy appeared normal.   8.  There is a trivial pericardial effusion.     Cardiovascular Health Factors  Vitals BP Readings from Last 3 Encounters:   04/20/23 (!) 166/99   09/13/22 (!) 195/101   08/26/21 (!) 148/98     Wt Readings from Last 3 Encounters:   04/20/23 121.2 kg (267 lb 3.2 oz)   09/13/22 116.3 kg (256 lb 6.4 oz)   08/26/21 111.9 kg (246 lb 12.8 oz)     BMI Readings from Last 3 Encounters:   04/20/23 43.13 kg/m?   09/13/22 41.38 kg/m?   08/26/21 39.83 kg/m?      Smoking Social History     Tobacco Use   Smoking Status Former    Types: Cigarettes   Smokeless Tobacco Never      Lipid Profile Cholesterol   Date Value Ref Range Status   10/14/2020 181 <200 MG/DL Final     HDL   Date Value Ref Range Status   10/14/2020 49 >40 MG/DL Final     LDL   Date Value Ref Range Status   10/14/2020 120 (H) <100 mg/dL Final     Triglycerides   Date Value Ref Range Status   10/14/2020 81 <150 MG/DL Final      Blood Sugar Hemoglobin A1C   Date Value Ref Range Status   10/14/2020 6.1 (H) 4.0 - 6.0 % Final     Comment:     The ADA recommends that most patients with type 1 and type 2 diabetes maintain   an A1c level <7%.  CHECKED       Glucose   Date Value Ref Range Status   04/20/2023 108 (H) 70 - 105 Final   09/09/2022 90  Final   11/12/2020 94  Final  Problems Addressed Today  Encounter Diagnoses   Name Primary?    Acute on chronic systolic (congestive) heart failure (HCC)     Chronic systolic heart failure (HCC)     Morbid (severe) obesity due to excess calories (HCC)        Assessment and Plan   Ms. Kosakowski has heart failure with mildly reduced ejection fraction.  Her heart failure appears to be extremely well compensated.  Her blood pressure was elevated in clinic today but she brought in a log book that shows excellent control of her blood pressure.  I thought about increasing her losartan or switching her to [facility] but she is doing very very well at the present time and she did not want to make any changes in her medical regimen.  Starting her on a SGLT2 inhibitor such as Jardiance would also be acceptable with appropriate monitoring.  Cardiovascular risk factor modification was discussed in great detail.  I have asked her to return for follow-up in 6 months time. The total time spent during this interview and exam with preparation and chart review was 30 minutes.         Current Medications (including today's revisions)   carvediloL (COREG) 25 mg tablet TAKE ONE TABLET BY MOUTH TWICE DAILY WITH MEALS. TAKE WITH FOOD.    furosemide (LASIX) 40 mg tablet Take one tablet by mouth daily as needed.    hydroCHLOROthiazide (HYDRODIURIL) 25 mg tablet TAKE 1 TABLET BY MOUTH EVERY DAY IN THE MORNING    levothyroxine (SYNTHROID) 125 mcg tablet Take one tablet by mouth daily.    losartan (COZAAR) 50 mg tablet TAKE 1 TABLET BY MOUTH DAILY    spironolactone (ALDACTONE) 25 mg tablet TAKE ONE TABLET BY MOUTH DAILY. TAKE WITH FOOD.

## 2023-04-20 NOTE — Patient Instructions
Follow up in 6 months    Follow up as directed.  Call sooner if issues.  Call the [facility] nursing line at [phone].  Leave a detailed message for the nurse in [facility] with how we can assist you and we will call you back.

## 2023-05-03 ENCOUNTER — Encounter: Admit: 2023-05-03 | Discharge: 2023-05-03 | Payer: MEDICARE

## 2023-05-03 MED ORDER — FUROSEMIDE 40 MG PO TAB
40 mg | ORAL_TABLET | Freq: Every day | ORAL | 3 refills | 90.00000 days | Status: AC | PRN
Start: 2023-05-03 — End: ?

## 2023-10-11 ENCOUNTER — Encounter: Admit: 2023-10-11 | Discharge: 2023-10-11 | Payer: MEDICARE

## 2023-10-11 MED ORDER — CARVEDILOL 25 MG PO TAB
25 mg | ORAL_TABLET | Freq: Two times a day (BID) | ORAL | 3 refills | 90.00000 days | Status: AC
Start: 2023-10-11 — End: ?

## 2023-10-25 ENCOUNTER — Encounter: Admit: 2023-10-25 | Discharge: 2023-10-25 | Payer: MEDICARE

## 2023-12-24 ENCOUNTER — Encounter: Admit: 2023-12-24 | Discharge: 2023-12-24 | Payer: MEDICARE

## 2024-03-10 ENCOUNTER — Encounter: Admit: 2024-03-10 | Discharge: 2024-03-10 | Payer: MEDICARE

## 2024-04-15 ENCOUNTER — Encounter: Admit: 2024-04-15 | Discharge: 2024-04-15 | Payer: MEDICARE

## 2024-04-15 MED ORDER — LOSARTAN 50 MG PO TAB
50 mg | ORAL_TABLET | Freq: Every day | ORAL | 0 refills | 90.00000 days | Status: AC
Start: 2024-04-15 — End: ?

## 2024-04-18 ENCOUNTER — Encounter: Admit: 2024-04-18 | Discharge: 2024-04-18 | Payer: MEDICARE

## 2024-04-18 MED ORDER — FUROSEMIDE 40 MG PO TAB
40 mg | ORAL_TABLET | Freq: Every day | ORAL | 0 refills | 90.00000 days | Status: AC
Start: 2024-04-18 — End: ?

## 2024-07-16 ENCOUNTER — Encounter: Admit: 2024-07-16 | Discharge: 2024-07-16 | Payer: MEDICARE

## 2024-08-26 ENCOUNTER — Encounter: Admit: 2024-08-26 | Discharge: 2024-08-26 | Payer: MEDICARE

## 2024-08-26 MED ORDER — LOSARTAN 50 MG PO TAB
50 mg | ORAL_TABLET | Freq: Every day | ORAL | 6 refills | 90.00000 days | Status: AC
Start: 2024-08-26 — End: ?

## 2024-10-02 ENCOUNTER — Encounter: Admit: 2024-10-02 | Discharge: 2024-10-02 | Payer: MEDICARE

## 2024-10-02 MED ORDER — CARVEDILOL 25 MG PO TAB
25 mg | ORAL_TABLET | Freq: Two times a day (BID) | ORAL | 3 refills | 90.00000 days | Status: AC
Start: 2024-10-02 — End: ?
